# Patient Record
Sex: Male | Born: 1975
Health system: Southern US, Community
[De-identification: ages and names within clinical notes are randomized; demographics above are authoritative.]

## PROBLEM LIST (undated history)

## (undated) DIAGNOSIS — M1990 Unspecified osteoarthritis, unspecified site: Secondary | ICD-10-CM

## (undated) DIAGNOSIS — I10 Essential (primary) hypertension: Secondary | ICD-10-CM

## (undated) HISTORY — PX: HERNIA REPAIR: SHX51

---

## 2019-12-26 ENCOUNTER — Encounter (HOSPITAL_BASED_OUTPATIENT_CLINIC_OR_DEPARTMENT_OTHER): Payer: Self-pay | Admitting: *Deleted

## 2019-12-26 ENCOUNTER — Other Ambulatory Visit: Payer: Self-pay

## 2019-12-26 ENCOUNTER — Inpatient Hospital Stay (HOSPITAL_BASED_OUTPATIENT_CLINIC_OR_DEPARTMENT_OTHER)
Admission: EM | Admit: 2019-12-26 | Discharge: 2019-12-29 | DRG: 287 | Disposition: A | Discharge status: Home / Self Care | Payer: Self-pay | Attending: Student | Admitting: Student

## 2019-12-26 ENCOUNTER — Emergency Department (HOSPITAL_BASED_OUTPATIENT_CLINIC_OR_DEPARTMENT_OTHER): Payer: Self-pay

## 2019-12-26 DIAGNOSIS — D509 Iron deficiency anemia, unspecified: Secondary | ICD-10-CM | POA: Diagnosis present

## 2019-12-26 DIAGNOSIS — Z6834 Body mass index (BMI) 34.0-34.9, adult: Secondary | ICD-10-CM

## 2019-12-26 DIAGNOSIS — E669 Obesity, unspecified: Secondary | ICD-10-CM | POA: Diagnosis present

## 2019-12-26 DIAGNOSIS — I161 Hypertensive emergency: Secondary | ICD-10-CM | POA: Diagnosis present

## 2019-12-26 DIAGNOSIS — R7989 Other specified abnormal findings of blood chemistry: Secondary | ICD-10-CM

## 2019-12-26 DIAGNOSIS — I10 Essential (primary) hypertension: Secondary | ICD-10-CM | POA: Diagnosis present

## 2019-12-26 DIAGNOSIS — G4733 Obstructive sleep apnea (adult) (pediatric): Secondary | ICD-10-CM | POA: Diagnosis present

## 2019-12-26 DIAGNOSIS — Z20822 Contact with and (suspected) exposure to covid-19: Secondary | ICD-10-CM | POA: Diagnosis present

## 2019-12-26 DIAGNOSIS — Z833 Family history of diabetes mellitus: Secondary | ICD-10-CM

## 2019-12-26 DIAGNOSIS — E876 Hypokalemia: Secondary | ICD-10-CM

## 2019-12-26 DIAGNOSIS — F17200 Nicotine dependence, unspecified, uncomplicated: Secondary | ICD-10-CM | POA: Diagnosis present

## 2019-12-26 DIAGNOSIS — Z8249 Family history of ischemic heart disease and other diseases of the circulatory system: Secondary | ICD-10-CM

## 2019-12-26 DIAGNOSIS — K5900 Constipation, unspecified: Secondary | ICD-10-CM | POA: Diagnosis present

## 2019-12-26 DIAGNOSIS — E785 Hyperlipidemia, unspecified: Secondary | ICD-10-CM | POA: Diagnosis present

## 2019-12-26 DIAGNOSIS — F17210 Nicotine dependence, cigarettes, uncomplicated: Secondary | ICD-10-CM | POA: Diagnosis present

## 2019-12-26 DIAGNOSIS — I160 Hypertensive urgency: Secondary | ICD-10-CM

## 2019-12-26 DIAGNOSIS — R079 Chest pain, unspecified: Secondary | ICD-10-CM | POA: Diagnosis present

## 2019-12-26 DIAGNOSIS — T39395A Adverse effect of other nonsteroidal anti-inflammatory drugs [NSAID], initial encounter: Secondary | ICD-10-CM | POA: Diagnosis present

## 2019-12-26 DIAGNOSIS — F1220 Cannabis dependence, uncomplicated: Secondary | ICD-10-CM | POA: Diagnosis present

## 2019-12-26 DIAGNOSIS — N179 Acute kidney failure, unspecified: Secondary | ICD-10-CM | POA: Diagnosis present

## 2019-12-26 DIAGNOSIS — R072 Precordial pain: Principal | ICD-10-CM | POA: Diagnosis present

## 2019-12-26 DIAGNOSIS — I272 Pulmonary hypertension, unspecified: Secondary | ICD-10-CM | POA: Diagnosis present

## 2019-12-26 DIAGNOSIS — R778 Other specified abnormalities of plasma proteins: Secondary | ICD-10-CM

## 2019-12-26 HISTORY — DX: Essential (primary) hypertension: I10

## 2019-12-26 HISTORY — DX: Unspecified osteoarthritis, unspecified site: M19.90

## 2019-12-26 LAB — CBC WITH DIFFERENTIAL/PLATELET
Abs Immature Granulocytes: 0.04 10*3/uL (ref 0.00–0.07)
Basophils Absolute: 0.1 10*3/uL (ref 0.0–0.1)
Basophils Relative: 1 %
Eosinophils Absolute: 0.3 10*3/uL (ref 0.0–0.5)
Eosinophils Relative: 3 %
HCT: 33.8 % — ABNORMAL LOW (ref 39.0–52.0)
Hemoglobin: 10.6 g/dL — ABNORMAL LOW (ref 13.0–17.0)
Immature Granulocytes: 0 %
Lymphocytes Relative: 30 %
Lymphs Abs: 3.1 10*3/uL (ref 0.7–4.0)
MCH: 23.5 pg — ABNORMAL LOW (ref 26.0–34.0)
MCHC: 31.4 g/dL (ref 30.0–36.0)
MCV: 74.9 fL — ABNORMAL LOW (ref 80.0–100.0)
Monocytes Absolute: 0.9 10*3/uL (ref 0.1–1.0)
Monocytes Relative: 8 %
Neutro Abs: 5.9 10*3/uL (ref 1.7–7.7)
Neutrophils Relative %: 58 %
Platelets: 331 10*3/uL (ref 150–400)
RBC: 4.51 MIL/uL (ref 4.22–5.81)
RDW: 18.6 % — ABNORMAL HIGH (ref 11.5–15.5)
WBC: 10.2 10*3/uL (ref 4.0–10.5)
nRBC: 0 % (ref 0.0–0.2)

## 2019-12-26 LAB — BASIC METABOLIC PANEL
Anion gap: 11 (ref 5–15)
BUN: 16 mg/dL (ref 6–20)
CO2: 30 mmol/L (ref 22–32)
Calcium: 8.8 mg/dL — ABNORMAL LOW (ref 8.9–10.3)
Chloride: 96 mmol/L — ABNORMAL LOW (ref 98–111)
Creatinine, Ser: 1.47 mg/dL — ABNORMAL HIGH (ref 0.61–1.24)
GFR calc Af Amer: >60 mL/min (ref >60)
GFR calc non Af Amer: 57 mL/min — ABNORMAL LOW (ref >60)
Glucose, Bld: 125 mg/dL — ABNORMAL HIGH (ref 70–99)
Potassium: 2.4 mmol/L — CL (ref 3.5–5.1)
Sodium: 137 mmol/L (ref 135–145)

## 2019-12-26 LAB — TROPONIN I (HIGH SENSITIVITY): Troponin I (High Sensitivity): 43 ng/L — ABNORMAL HIGH (ref <18)

## 2019-12-26 LAB — MAGNESIUM: Magnesium: 2.1 mg/dL (ref 1.7–2.4)

## 2019-12-26 MED ORDER — METOCLOPRAMIDE HCL 5 MG/ML IJ SOLN
10.0000 mg | Freq: Once | INTRAMUSCULAR | Status: AC
  Administered 2019-12-26: 10 mg via INTRAVENOUS
  Filled 2019-12-26: qty 2

## 2019-12-26 MED ORDER — ASPIRIN 81 MG PO CHEW: 324.0000 mg | CHEWABLE_TABLET | Freq: Once | ORAL | Status: DC

## 2019-12-26 MED ORDER — DIPHENHYDRAMINE HCL 50 MG/ML IJ SOLN
25.0000 mg | Freq: Once | INTRAMUSCULAR | Status: AC
  Administered 2019-12-26: 25 mg via INTRAVENOUS
  Filled 2019-12-26: qty 1

## 2019-12-26 MED ORDER — POTASSIUM CHLORIDE CRYS ER 20 MEQ PO TBCR
40.0000 meq | EXTENDED_RELEASE_TABLET | Freq: Once | ORAL | Status: AC
  Administered 2019-12-27: 40 meq via ORAL
  Filled 2019-12-26: qty 2

## 2019-12-26 MED ORDER — NITROGLYCERIN 0.4 MG SL SUBL
0.4000 mg | SUBLINGUAL_TABLET | SUBLINGUAL | Status: DC | PRN
  Administered 2019-12-26: 0.4 mg via SUBLINGUAL
  Filled 2019-12-26: qty 1

## 2019-12-26 MED ORDER — ASPIRIN 81 MG PO CHEW
324.0000 mg | CHEWABLE_TABLET | Freq: Once | ORAL | Status: AC
  Administered 2019-12-26: 324 mg via ORAL
  Filled 2019-12-26: qty 4

## 2019-12-26 MED ORDER — POTASSIUM CHLORIDE 10 MEQ/100ML IV SOLN
10.0000 meq | INTRAVENOUS | Status: AC
  Administered 2019-12-27 (×3): 10 meq via INTRAVENOUS
  Filled 2019-12-26 (×3): qty 100

## 2019-12-26 MED ORDER — LACTATED RINGERS IV BOLUS
1000.0000 mL | Freq: Once | INTRAVENOUS | Status: AC
  Administered 2019-12-26: 1000 mL via INTRAVENOUS

## 2019-12-26 NOTE — ED Provider Notes (Signed)
[facility] EMERGENCY DEPARTMENT Provider Note   CSN: [id number] Arrival date & time: 12/26/19  1836     History Chief Complaint  Patient presents with  . Hypertension    Roger Wolfe is a 44 y.o. male.  HPI 44 year old male presents with chest pain and headache.  He is vague on the exact symptomatology but states he is been having the chest pain for the past week and a half.  Comes and goes often.  Sometimes all day, sometimes for an hour.  It feels like a heaviness in his chest.  No exertional component.  Sometimes has shortness of breath but is not sure if it really occurs with the chest pain.  He knows he has high blood pressure but has not taken meds since he got out of jail in 2018.  He is also been having headaches since he got out of jail that come and go and are often severe.  Currently has 1 now.  These have been ongoing since 2018.  No vision changes.   Past Medical History:  Diagnosis Date  . Arthritis   . Hypertension     Patient Active Problem List   Diagnosis Date Noted  . Chest pain 12/26/2019    Past Surgical History:  Procedure Laterality Date  . HERNIA REPAIR         No family history on file.  Social History   Tobacco Use  . Smoking status: Current Every Day Smoker  . Smokeless tobacco: Never Used  Substance Use Topics  . Alcohol use: Yes  . Drug use: Yes    Types: Marijuana    Home Medications Prior to Admission medications   Not on File    Allergies    Patient has no known allergies.  Review of Systems   Review of Systems  Constitutional: Negative for fever.  Eyes: Negative for visual disturbance.  Respiratory: Positive for shortness of breath.   Cardiovascular: Positive for chest pain.  Gastrointestinal: Negative for abdominal pain and vomiting.  Neurological: Positive for headaches. Negative for weakness and numbness.  All other systems reviewed and are negative.   Physical Exam Updated Vital Signs BP (!) 179/113    Pulse 70   Temp 98.9 F (37.2 C) (Oral)   Resp 15   Ht 5' (1.524 m)   SpO2 99%   Physical Exam Vitals and nursing note reviewed.  Constitutional:      General: He is not in acute distress.    Appearance: He is well-developed. He is not ill-appearing or diaphoretic.  HENT:     Head: Normocephalic and atraumatic.     Right Ear: External ear normal.     Left Ear: External ear normal.     Nose: Nose normal.  Eyes:     General:        Right eye: No discharge.        Left eye: No discharge.     Extraocular Movements: Extraocular movements intact.     Pupils: Pupils are equal, round, and reactive to light.  Cardiovascular:     Rate and Rhythm: Normal rate and regular rhythm.     Heart sounds: Normal heart sounds.  Pulmonary:     Effort: Pulmonary effort is normal.     Breath sounds: Normal breath sounds.  Chest:     Chest wall: Tenderness present.  Abdominal:     Palpations: Abdomen is soft.     Tenderness: There is no abdominal tenderness.  Musculoskeletal:  Cervical back: Neck supple.  Skin:    General: Skin is warm and dry.  Neurological:     Mental Status: He is alert.     Comments: CN 3-12 grossly intact. 5/5 strength in all 4 extremities. Grossly normal sensation. Normal finger to nose.   Psychiatric:        Mood and Affect: Mood is not anxious.     ED Results / Procedures / Treatments   Labs (all labs ordered are listed, but only abnormal results are displayed) Labs Reviewed  BASIC METABOLIC PANEL - Abnormal; Notable for the following components:      Result Value   Potassium 2.4 (*)    Chloride 96 (*)    Glucose, Bld 125 (*)    Creatinine, Ser 1.47 (*)    Calcium 8.8 (*)    GFR calc non Af Amer 57 (*)    All other components within normal limits  CBC WITH DIFFERENTIAL/PLATELET - Abnormal; Notable for the following components:   Hemoglobin 10.6 (*)    HCT 33.8 (*)    MCV 74.9 (*)    MCH 23.5 (*)    RDW 18.6 (*)    All other components within  normal limits  TROPONIN I (HIGH SENSITIVITY) - Abnormal; Notable for the following components:   Troponin I (High Sensitivity) 43 (*)    All other components within normal limits  SARS CORONAVIRUS 2 (TAT 6-24 HRS)  MAGNESIUM  TROPONIN I (HIGH SENSITIVITY)    EKG EKG Interpretation  Date/Time:  Monday December 26 2019 22:04:19 EST Ventricular Rate:  67 PR Interval:  106 QRS Duration: 99 QT Interval:  432 QTC Calculation: 457 R Axis:   24 Text Interpretation: Sinus rhythm Abnormal R-wave progression, early transition LVH with secondary repolarization abnormality no significant change since earlier in the day Confirmed by Pricilla Loveless [phone]) on 12/26/2019 11:02:13 PM   Radiology DG Chest 2 View  Result Date: 12/26/2019 CLINICAL DATA:  Chest pain EXAM: CHEST - 2 VIEW COMPARISON:  None. FINDINGS: The heart size and mediastinal contours are within normal limits. Both lungs are clear. The visualized skeletal structures are unremarkable. IMPRESSION: No active cardiopulmonary disease. Electronically Signed   By: Jasmine Pang M.D.   On: 12/26/2019 22:34   CT Head Wo Contrast  Result Date: 12/26/2019 CLINICAL DATA:  Headache for 2 weeks EXAM: CT HEAD WITHOUT CONTRAST TECHNIQUE: Contiguous axial images were obtained from the base of the skull through the vertex without intravenous contrast. COMPARISON:  None. FINDINGS: Brain: No acute territorial infarction, hemorrhage or intracranial mass. The ventricles are nonenlarged Vascular: No hyperdense vessel or unexpected calcification. Skull: Normal. Negative for fracture or focal lesion. Sinuses/Orbits: No acute finding. Other: Skin thickening over the left parietal vertex with soft tissue density extending towards the calvarium IMPRESSION: 1. No CT evidence for acute intracranial abnormality. 2. Left parietal vertex scalp thickening with soft tissue density extending to the calvarium, question an area of scarring versus skin lesion. Correlate with  physical examination. Electronically Signed   By: Jasmine Pang M.D.   On: 12/26/2019 22:34    Procedures .Critical Care Performed by: Pricilla Loveless, MD Authorized by: Pricilla Loveless, MD   Critical care provider statement:    Critical care time (minutes):  35   Critical care time was exclusive of:  Separately billable procedures and treating other patients   Critical care was necessary to treat or prevent imminent or life-threatening deterioration of the following conditions:  Cardiac failure   Critical care was  time spent personally by me on the following activities:  Discussions with consultants, evaluation of patient's response to treatment, examination of patient, ordering and performing treatments and interventions, ordering and review of laboratory studies, ordering and review of radiographic studies, pulse oximetry, re-evaluation of patient's condition, obtaining history from patient or surrogate and review of old charts   (including critical care time)  Medications Ordered in ED Medications  nitroGLYCERIN (NITROSTAT) SL tablet 0.4 mg (0.4 mg Sublingual Given 12/26/19 2349)  aspirin chewable tablet 324 mg (324 mg Oral Not Given 12/26/19 2349)  potassium chloride SA (KLOR-CON) CR tablet 40 mEq (has no administration in time range)  potassium chloride 10 mEq in 100 mL IVPB (has no administration in time range)  aspirin chewable tablet 324 mg (324 mg Oral Given 12/26/19 2230)  metoCLOPramide (REGLAN) injection 10 mg (10 mg Intravenous Given 12/26/19 2234)  diphenhydrAMINE (BENADRYL) injection 25 mg (25 mg Intravenous Given 12/26/19 2231)  lactated ringers bolus 1,000 mL (0 mLs Intravenous Stopped 12/26/19 2330)    ED Course  I have reviewed the triage vital signs and the nursing notes.  Pertinent labs & imaging results that were available during my care of the patient were reviewed by me and considered in my medical decision making (see chart for details).    MDM Rules/Calculators/A&P                       Patient is found to have mildly elevated troponin of 43.  This is nonspecific.  He has been having the chest pain on and off for about a week and a half but did recurred today.  Sounds atypical but given his uncontrolled blood pressure and troponin elevation as well as LVH on ECG I think he will need admission for further work-up and care.  Will be given nitroglycerin.  His headache sounds chronic and CT does not find any obvious pathology.  I do not see any obvious skin changes to his scalp.  Neuro exam benign.  Discussed with Dr. Kalman Shan who recommends blood pressure control and echo and trending troponin.  No heparin.  Dr. Marlowe Sax will admit.  He is also found to have significantly low hypokalemia which will be repleted. Final Clinical Impression(s) / ED Diagnoses Final diagnoses:  Hypertensive urgency  Hypokalemia  Elevated troponin    Rx / DC Orders ED Discharge Orders    None       Sherwood Gambler, MD 12/27/19 [phone]

## 2019-12-26 NOTE — ED Triage Notes (Signed)
Chest pain and headache x 2 weeks. Hx HTN. States he has not taken his medication in 2 years since he was incarcerated.

## 2019-12-27 ENCOUNTER — Encounter (HOSPITAL_COMMUNITY): Payer: Self-pay | Admitting: Internal Medicine

## 2019-12-27 ENCOUNTER — Observation Stay (HOSPITAL_BASED_OUTPATIENT_CLINIC_OR_DEPARTMENT_OTHER): Payer: Self-pay

## 2019-12-27 DIAGNOSIS — R0789 Other chest pain: Secondary | ICD-10-CM

## 2019-12-27 DIAGNOSIS — F1220 Cannabis dependence, uncomplicated: Secondary | ICD-10-CM

## 2019-12-27 DIAGNOSIS — E785 Hyperlipidemia, unspecified: Secondary | ICD-10-CM | POA: Diagnosis present

## 2019-12-27 DIAGNOSIS — R079 Chest pain, unspecified: Secondary | ICD-10-CM

## 2019-12-27 DIAGNOSIS — E669 Obesity, unspecified: Secondary | ICD-10-CM | POA: Diagnosis present

## 2019-12-27 DIAGNOSIS — F17200 Nicotine dependence, unspecified, uncomplicated: Secondary | ICD-10-CM | POA: Diagnosis present

## 2019-12-27 DIAGNOSIS — I340 Nonrheumatic mitral (valve) insufficiency: Secondary | ICD-10-CM

## 2019-12-27 DIAGNOSIS — I160 Hypertensive urgency: Secondary | ICD-10-CM | POA: Diagnosis present

## 2019-12-27 LAB — COMPREHENSIVE METABOLIC PANEL
ALT: 20 U/L (ref 0–44)
AST: 27 U/L (ref 15–41)
Albumin: 3.7 g/dL (ref 3.5–5.0)
Alkaline Phosphatase: 66 U/L (ref 38–126)
Anion gap: 11 (ref 5–15)
BUN: 9 mg/dL (ref 6–20)
CO2: 29 mmol/L (ref 22–32)
Calcium: 8.9 mg/dL (ref 8.9–10.3)
Chloride: 101 mmol/L (ref 98–111)
Creatinine, Ser: 1.1 mg/dL (ref 0.61–1.24)
GFR calc Af Amer: >60 mL/min (ref >60)
GFR calc non Af Amer: >60 mL/min (ref >60)
Glucose, Bld: 104 mg/dL — ABNORMAL HIGH (ref 70–99)
Potassium: 3 mmol/L — ABNORMAL LOW (ref 3.5–5.1)
Sodium: 141 mmol/L (ref 135–145)
Total Bilirubin: 0.7 mg/dL (ref 0.3–1.2)
Total Protein: 6.8 g/dL (ref 6.5–8.1)

## 2019-12-27 LAB — ECHOCARDIOGRAM COMPLETE
Height: 60 in
Weight: 2790.4 oz

## 2019-12-27 LAB — SARS CORONAVIRUS 2 (TAT 6-24 HRS): SARS Coronavirus 2: NEGATIVE

## 2019-12-27 LAB — RAPID URINE DRUG SCREEN, HOSP PERFORMED
Amphetamines: NOT DETECTED
Barbiturates: NOT DETECTED
Benzodiazepines: NOT DETECTED
Cocaine: NOT DETECTED
Opiates: NOT DETECTED
Tetrahydrocannabinol: POSITIVE — AB

## 2019-12-27 LAB — LIPID PANEL
Cholesterol: 158 mg/dL (ref 0–200)
HDL: 31 mg/dL — ABNORMAL LOW (ref >40)
LDL Cholesterol: 108 mg/dL — ABNORMAL HIGH (ref 0–99)
Total CHOL/HDL Ratio: 5.1 RATIO
Triglycerides: 97 mg/dL (ref <150)
VLDL: 19 mg/dL (ref 0–40)

## 2019-12-27 LAB — TROPONIN I (HIGH SENSITIVITY): Troponin I (High Sensitivity): 38 ng/L — ABNORMAL HIGH (ref <18)

## 2019-12-27 LAB — HEMOGLOBIN A1C
Hgb A1c MFr Bld: 5.6 % (ref 4.8–5.6)
Mean Plasma Glucose: 114.02 mg/dL

## 2019-12-27 LAB — TSH: TSH: 0.489 u[IU]/mL (ref 0.350–4.500)

## 2019-12-27 LAB — HIV ANTIBODY (ROUTINE TESTING W REFLEX): HIV Screen 4th Generation wRfx: NONREACTIVE

## 2019-12-27 MED ORDER — NICOTINE 21 MG/24HR TD PT24
21.0000 mg | MEDICATED_PATCH | Freq: Every day | TRANSDERMAL | Status: DC
  Administered 2019-12-27 – 2019-12-29 (×3): 21 mg via TRANSDERMAL
  Filled 2019-12-27 (×3): qty 1

## 2019-12-27 MED ORDER — ENSURE ENLIVE PO LIQD
237.0000 mL | Freq: Two times a day (BID) | ORAL | Status: DC
  Administered 2019-12-27 – 2019-12-28 (×3): 237 mL via ORAL

## 2019-12-27 MED ORDER — ONDANSETRON HCL 4 MG/2ML IJ SOLN
4.0000 mg | Freq: Four times a day (QID) | INTRAMUSCULAR | Status: DC | PRN
  Administered 2019-12-27: 4 mg via INTRAVENOUS
  Filled 2019-12-27: qty 2

## 2019-12-27 MED ORDER — AMLODIPINE BESYLATE 10 MG PO TABS
10.0000 mg | ORAL_TABLET | Freq: Every day | ORAL | Status: DC
  Administered 2019-12-28 – 2019-12-29 (×2): 10 mg via ORAL
  Filled 2019-12-27 (×2): qty 1

## 2019-12-27 MED ORDER — HYDRALAZINE HCL 20 MG/ML IJ SOLN: 5.0000 mg | INTRAMUSCULAR | Status: DC | PRN

## 2019-12-27 MED ORDER — POTASSIUM CHLORIDE IN NACL 20-0.9 MEQ/L-% IV SOLN
INTRAVENOUS | Status: DC
  Filled 2019-12-27 (×4): qty 1000

## 2019-12-27 MED ORDER — ASPIRIN EC 81 MG PO TBEC
81.0000 mg | DELAYED_RELEASE_TABLET | Freq: Every day | ORAL | Status: DC
  Administered 2019-12-27 – 2019-12-28 (×2): 81 mg via ORAL
  Filled 2019-12-27: qty 1

## 2019-12-27 MED ORDER — LOSARTAN POTASSIUM 50 MG PO TABS
100.0000 mg | ORAL_TABLET | Freq: Every day | ORAL | Status: DC
  Administered 2019-12-27 – 2019-12-28 (×2): 100 mg via ORAL
  Filled 2019-12-27 (×2): qty 2

## 2019-12-27 MED ORDER — ACETAMINOPHEN 325 MG PO TABS
650.0000 mg | ORAL_TABLET | ORAL | Status: DC | PRN
  Administered 2019-12-27 – 2019-12-29 (×6): 650 mg via ORAL
  Filled 2019-12-27 (×6): qty 2

## 2019-12-27 MED ORDER — ENOXAPARIN SODIUM 40 MG/0.4ML ~~LOC~~ SOLN
40.0000 mg | SUBCUTANEOUS | Status: DC
  Administered 2019-12-27 – 2019-12-28 (×2): 40 mg via SUBCUTANEOUS
  Filled 2019-12-27 (×2): qty 0.4

## 2019-12-27 MED ORDER — ATORVASTATIN CALCIUM 10 MG PO TABS
20.0000 mg | ORAL_TABLET | Freq: Every day | ORAL | Status: DC
  Administered 2019-12-27 – 2019-12-28 (×2): 20 mg via ORAL
  Filled 2019-12-27 (×2): qty 2

## 2019-12-27 MED ORDER — AMLODIPINE BESYLATE 5 MG PO TABS
5.0000 mg | ORAL_TABLET | Freq: Every day | ORAL | Status: DC
  Administered 2019-12-27: 5 mg via ORAL
  Filled 2019-12-27: qty 1

## 2019-12-27 NOTE — Progress Notes (Signed)
Attempted echo. Patient asked that I come back later. Begging for breakfast first. Will get to as schedule permits.

## 2019-12-27 NOTE — H&P (Signed)
History and Physical    Roger Wolfe IBB:[id number] DOB: 02-23-1976 DOA: 12/26/2019  PCP: Patient, No Pcp Per Consultants:  None Patient coming from:  Home - lives with mother, uncle; NOK: Mother, Roger Wolfe, [phone]; girlfriend, Roger Wolfe, [phone]  Chief Complaint: chest pain  HPI: Roger Wolfe is a 44 y.o. male with medical history significant of HTN and arthritis presenting with chest pain.  Chest, head, and abdominal pain.  Symptoms have been "going on for a while, I been fighting it."  Occasional SOB.  Substernal CP and into his back but he has chronic arthritis in his back.  Sporadic CP without obvious exertional component.  Resolves spontaneously.  No current CP but he is having headache.  He has h/o HTN but has not taken this in years.  He had bloody stools and abdominal pain when he was in prison; released in 2018.   ED Course:  Carryover, per Dr. Marlowe Sax:  44 year old male with history of hypertension currently not on any medications presenting with complaints of chest pain x1.5 weeks and headache x2 years. Blood pressure updated. Troponin 43. EKG with LVH and inferolateral T wave inversions. Potassium low at 2.4, unclear etiology. ED provider discussed the case with cardiology who did not recommend starting heparin. Recommended trending troponin and obtaining an echocardiogram in the morning. Cardiology felt that the troponin elevation/ EKG changes are likely due to elevated blood pressure and creatinine 1.5.   Review of Systems: As per HPI; otherwise review of systems reviewed and negative.   Ambulatory Status:  Ambulates without assistance  Past Medical History:  Diagnosis Date  . Arthritis   . Hypertension     Past Surgical History:  Procedure Laterality Date  . HERNIA REPAIR      Social History   Socioeconomic History  . Marital status: Single    Spouse name: Not on file  . Number of children: Not on file  . Years of education: Not on file    . Highest education level: Not on file  Occupational History  . Not on file  Tobacco Use  . Smoking status: Current Every Day Smoker    Packs/day: 2.00    Years: 29.00    Pack years: 58.00  . Smokeless tobacco: Never Used  Substance and Sexual Activity  . Alcohol use: Yes    Comment: occasional beer  . Drug use: Yes    Types: Marijuana    Comment: marijuana every other day  . Sexual activity: Not on file  Other Topics Concern  . Not on file  Social History Narrative  . Not on file   Social Determinants of Health   Financial Resource Strain:   . Difficulty of Paying Living Expenses: Not on file  Food Insecurity:   . Worried About Charity fundraiser in the Last Year: Not on file  . Ran Out of Food in the Last Year: Not on file  Transportation Needs:   . Lack of Transportation (Medical): Not on file  . Lack of Transportation (Non-Medical): Not on file  Physical Activity:   . Days of Exercise per Week: Not on file  . Minutes of Exercise per Session: Not on file  Stress:   . Feeling of Stress : Not on file  Social Connections:   . Frequency of Communication with Friends and Family: Not on file  . Frequency of Social Gatherings with Friends and Family: Not on file  . Attends Religious Services: Not on file  . Active Member of  Clubs or Organizations: Not on file  . Attends Banker Meetings: Not on file  . Marital Status: Not on file  Intimate Partner Violence:   . Fear of Current or Ex-Partner: Not on file  . Emotionally Abused: Not on file  . Physically Abused: Not on file  . Sexually Abused: Not on file    No Known Allergies  Family History  Problem Relation Age of Onset  . CAD Other        cousins  . Diabetes Sister     Prior to Admission medications   Not on File    Physical Exam: Vitals:   12/27/19 0926 12/27/19 1320 12/27/19 1441 12/27/19 1538  BP: (!) 182/117 (!) 173/117 (!) 176/115 (!) 160/90  Pulse:    69  Resp:      Temp:    98.3  F (36.8 C)  TempSrc:    Oral  SpO2: 98%   100%  Weight:      Height:         . General:  Appears calm and comfortable and is NAD . Eyes:   EOMI, normal lids, iris . ENT:  grossly normal hearing, lips & tongue, mmm . Neck:  no LAD, masses or thyromegaly . Cardiovascular:  RRR, no m/r/g. No LE edema.  Marland Kitchen Respiratory:   CTA bilaterally with no wheezes/rales/rhonchi.  Normal respiratory effort. . Abdomen:  soft, NT, ND, NABS . Back:   normal alignment, no CVAT . Skin:  no rash or induration seen on limited exam . Musculoskeletal:  grossly normal tone BUE/BLE, good ROM, no bony abnormality . Psychiatric:  grossly normal mood and affect, speech fluent and appropriate, AOx3 . Neurologic:  CN 2-12 grossly intact, moves all extremities in coordinated fashion    Radiological Exams on Admission: DG Chest 2 View  Result Date: 12/26/2019 CLINICAL DATA:  Chest pain EXAM: CHEST - 2 VIEW COMPARISON:  None. FINDINGS: The heart size and mediastinal contours are within normal limits. Both lungs are clear. The visualized skeletal structures are unremarkable. IMPRESSION: No active cardiopulmonary disease. Electronically Signed   By: Jasmine Pang M.D.   On: 12/26/2019 22:34   CT Head Wo Contrast  Result Date: 12/26/2019 CLINICAL DATA:  Headache for 2 weeks EXAM: CT HEAD WITHOUT CONTRAST TECHNIQUE: Contiguous axial images were obtained from the base of the skull through the vertex without intravenous contrast. COMPARISON:  None. FINDINGS: Brain: No acute territorial infarction, hemorrhage or intracranial mass. The ventricles are nonenlarged Vascular: No hyperdense vessel or unexpected calcification. Skull: Normal. Negative for fracture or focal lesion. Sinuses/Orbits: No acute finding. Other: Skin thickening over the left parietal vertex with soft tissue density extending towards the calvarium IMPRESSION: 1. No CT evidence for acute intracranial abnormality. 2. Left parietal vertex scalp thickening with soft  tissue density extending to the calvarium, question an area of scarring versus skin lesion. Correlate with physical examination. Electronically Signed   By: Jasmine Pang M.D.   On: 12/26/2019 22:34   ECHOCARDIOGRAM COMPLETE  Result Date: 12/27/2019    ECHOCARDIOGRAM REPORT   Patient Name:   JHAYDEN DEMURO Date of Exam: 12/27/2019 Medical Rec #:  [id number]    Height:       60.0 in Accession #:    [id number]   Weight:       174.4 lb Date of Birth:  02-06-1976    BSA:          1.761 m Patient Age:    43 years  BP:           182/117 mmHg Patient Gender: M            HR:           63 bpm. Exam Location:  Inpatient Procedure: 2D Echo, Cardiac Doppler and Color Doppler Indications:    Chest Pain 786.50  History:        Patient has no prior history of Echocardiogram examinations.                 Signs/Symptoms:Chest Pain; Risk Factors:Current Smoker.  Sonographer:    Renella CunasJulia Swaim RDCS Referring Phys: [id number] Deveron Shamoon IMPRESSIONS  1. Left ventricular ejection fraction, by estimation, is 50%. The left ventricle has low normal function. The left ventricle has no regional wall motion abnormalities. The left ventricular internal cavity size was mildly dilated. There is moderate concentric left ventricular hypertrophy. Left ventricular diastolic parameters were surprisingly normal. Only mitral annular diastolic velocities were borderline reduced for age.  2. Right ventricular systolic function is normal. The right ventricular size is normal. There is moderately elevated pulmonary artery systolic pressure.  3. Left atrial size was mildly dilated.  4. The mitral valve is normal in structure. Mild mitral valve regurgitation. No evidence of mitral stenosis.  5. The aortic valve is normal in structure. Aortic valve regurgitation is not visualized. No aortic stenosis is present.  6. The inferior vena cava is normal in size with greater than 50% respiratory variability, suggesting right atrial pressure of 3 mmHg. FINDINGS  Left  Ventricle: Left ventricular ejection fraction, by estimation, is 50%. The left ventricle has low normal function. The left ventricle has no regional wall motion abnormalities. The left ventricular internal cavity size was mildly dilated. There is moderate concentric left ventricular hypertrophy. Left ventricular diastolic parameters were normal. Normal left ventricular filling pressure. Right Ventricle: The right ventricular size is normal. No increase in right ventricular wall thickness. Right ventricular systolic function is normal. There is moderately elevated pulmonary artery systolic pressure. The tricuspid regurgitant velocity is 3.11 m/s, and with an assumed right atrial pressure of 3 mmHg, the estimated right ventricular systolic pressure is 41.7 mmHg. Left Atrium: Left atrial size was mildly dilated. Right Atrium: Right atrial size was normal in size. Pericardium: There is no evidence of pericardial effusion. Mitral Valve: The mitral valve is normal in structure. Normal mobility of the mitral valve leaflets. Mild mitral valve regurgitation. No evidence of mitral valve stenosis. Tricuspid Valve: The tricuspid valve is normal in structure. Tricuspid valve regurgitation is not demonstrated. No evidence of tricuspid stenosis. Aortic Valve: The aortic valve is normal in structure. Aortic valve regurgitation is not visualized. No aortic stenosis is present. Pulmonic Valve: The pulmonic valve was normal in structure. Pulmonic valve regurgitation is not visualized. No evidence of pulmonic stenosis. Aorta: The aortic root is normal in size and structure. Venous: The inferior vena cava is normal in size with greater than 50% respiratory variability, suggesting right atrial pressure of 3 mmHg. IAS/Shunts: No atrial level shunt detected by color flow Doppler.  LEFT VENTRICLE PLAX 2D LVIDd:         5.80 cm      Diastology LVIDs:         4.73 cm      LV e' lateral:   9.57 cm/s LV PW:         1.29 cm      LV E/e' lateral:  7.8 LV IVS:  1.49 cm      LV e' medial:    8.03 cm/s LVOT diam:     1.90 cm      LV E/e' medial:  9.3 LV SV:         54 LV SV Index:   30 LVOT Area:     2.84 cm  LV Volumes (MOD) LV vol d, MOD A2C: 158.0 ml LV vol d, MOD A4C: 178.0 ml LV vol s, MOD A2C: 77.9 ml LV vol s, MOD A4C: 88.6 ml LV SV MOD A2C:     80.1 ml LV SV MOD A4C:     178.0 ml LV SV MOD BP:      84.7 ml RIGHT VENTRICLE RV S prime:     10.30 cm/s TAPSE (M-mode): 2.7 cm LEFT ATRIUM             Index       RIGHT ATRIUM           Index LA diam:        4.00 cm 2.27 cm/m  RA Area:     14.40 cm LA Vol (A2C):   54.6 ml 31.00 ml/m RA Volume:   36.50 ml  20.73 ml/m LA Vol (A4C):   63.0 ml 35.77 ml/m LA Biplane Vol: 59.2 ml 33.62 ml/m  AORTIC VALVE LVOT Vmax:   109.00 cm/s LVOT Vmean:  72.900 cm/s LVOT VTI:    0.189 m  AORTA Ao Root diam: 3.00 cm MITRAL VALVE               TRICUSPID VALVE MV Area (PHT): 5.38 cm    TR Peak grad:   38.7 mmHg MV Decel Time: 141 msec    TR Vmax:        311.00 cm/s MV E velocity: 74.40 cm/s MV A velocity: 34.50 cm/s  SHUNTS MV E/A ratio:  2.16        Systemic VTI:  0.19 m                            Systemic Diam: 1.90 cm Rachelle Hora Croitoru MD Electronically signed by Thurmon Fair MD Signature Date/Time: 12/27/2019/2:54:59 PM    Final     EKG: Independently reviewed.   1854 - NSR with rate 74; LVH with repolarization abnormality 2204 - NSR with rate 67; LVH with repolarization abnormality with NSCSLT  Labs on Admission: I have personally reviewed the available labs and imaging studies at the time of the admission.  Pertinent labs:   K+ 2.4 Glucose 125 BUN 16/Creatinine 1.47/GFR 57 HS troponin 38, 43 WBC 10.2 Hgb 10.6 COVID negative A1c 5.6 HIV negative Lipids: 158/31/108/97 UDS + THC   Assessment/Plan Active Problems:   Chest pain   Chest pain -Patient with unspecified timeframe ("a while") for substernal chest pressure, nonexertional, appears to resolve spontaneously. -1/3 typical symptoms  suggestive of noncardiac chest pain.  -CXR unremarkable.   -Initial cardiac HS troponin mildly elevated with negative delta -EKG not indicative of acute ischemia but with repolarization abnormality   -Will plan to place in observation status on telemetry to rule out ACS by overnight observation.  -Start ASA 81 mg daily -Risk factor stratification with HgbA1c and FLP; will also check TSH and UDS -Cardiology consultation -Echo ordered  Hypertensive urgency -Reports h/o HTN for which he was previously on medication but stopped -We discussed the importance of long-term medication compliance and the potential consequences of long-term uncontrolled HTN -Based  on his poor BP control, will observe patient on progressive care unit -Will add Norvasc -Will add prn hydralazine  Obesity -BMI 34 -Weight loss should be encouraged  HLD -Will start Lipitor based on goal LDL <70  Tobacco dependence -Tobacco Dependence: encourage cessation.   -This was discussed with the patient and should be reviewed on an ongoing basis.   -Patch ordered at patient request.  Marijuana dependence -Cessation encouraged; this should be encouraged on an ongoing basis -UDS ordered     Note: This patient has been tested and is negative for the novel coronavirus COVID-19.    DVT prophylaxis: Lovenox  Code Status:  Full - confirmed with patient Family Communication: None present; I called to discuss the patient with his girlfriend - she can pick him up tomorrow after 3pm Disposition Plan:  He is anticipated to d/c to home without [facility] services once her cardiology issues have been resolved. Consults called: Cardiology  Admission status: It is my clinical opinion that referral for OBSERVATION is reasonable and necessary in this patient based on the above information provided. The aforementioned taken together are felt to place the patient at high risk for further clinical deterioration. However it is anticipated that  the patient may be medically stable for discharge from the hospital within 24 to 48 hours.     Jonah Blue MD [facility]   How to contact the [facility] Attending or Consulting provider 7A - 7P or covering provider during after hours 7P -7A, for this patient?  1. Check the care team in [facility] and look for a) attending/consulting [facility] provider listed and b) the [facility] team listed 2. Log into [email or web address] and use [place]'s universal password to access. If you do not have the password, please contact the hospital operator. 3. Locate the [facility] provider you are looking for under [facility] and page to a number that you can be directly reached. 4. If you still have difficulty reaching the provider, please page the [facility] (Director on Call) for the Hospitalists listed on amion for assistance.   12/27/2019, 4:32 PM

## 2019-12-27 NOTE — Progress Notes (Signed)
  Echocardiogram 2D Echocardiogram has been performed.  Roger Wolfe 12/27/2019, 1:55 PM

## 2019-12-27 NOTE — ED Notes (Signed)
Reviewed trending bp's with Dr Bebe Shaggy; no new orders received; patient denies any pain at this time.

## 2019-12-27 NOTE — Progress Notes (Signed)
Pt BP 182/117. Ophelia Charter, MD aware.

## 2019-12-27 NOTE — ED Notes (Signed)
Microwave dinner prepared and given to patient.

## 2019-12-27 NOTE — Consult Note (Addendum)
Cardiology Consultation:   Patient ID: Logen Heintzelman MRN: [id number]; DOB: Aug 23, 1976  Admit date: 12/26/2019 Date of Consult: 12/27/2019  Primary Care Provider: Patient, No Pcp Per Primary Cardiologist: Jenkins Rouge, MD  Primary Electrophysiologist:  None    Patient Profile:   Colburn Asper is a 44 y.o. male with a hx of HTN, ongoing tobacco use, marijuana use, and arthritis who is being seen today for the evaluation of chest pain and elevated troponin at the request of Dr. Lorin Mercy.  History of Present Illness:   Mr. Lipford does not currently take antihypertensive medications and has no prior cardiac history. He presented to [facility] for evaluation of chest pain and abdominal pain. He is a current every day smoker with marijuana use. UDS pending.   On presentation, he is anemic with Hb 10.6 and hypokalemic with initial K 2.4, now 3.0. He was significantly hypertensive to 182/120. AKI with sCr 1.47, now resolved. Cardiology was asked to consult for chest pain and elevated troponin. He is aware of his HTN diagnosis, but has not taken medication since his release from jail in 2018.   On my interview, he may have taken lopressor in the past for HTN. He denies cocaine use. He reports having nearly daily headaches for greater than 1 year. He works in Research officer, trade union. He reports having chest pain that started when he was walking around about 1 week ago. The chest pain was located in his lower sternum area without radiation. The CP would last for hours and would only be relieved when he slept. CP was almost always associated with a headache. Headache was associated with nausea and occasional vomiting. He denies palpitations, orthopnea, lower extremity swelling, and syncope.  He received 324 mg ASA and nitro x 1 in the ER. He has been chest pain free since arriving at [facility].  He does not have a family history of heart diease and denies history of MI, stroke, and stress test.    Heart Pathway Score:   HEAR Score: 5  Past Medical History:  Diagnosis Date  . Arthritis   . Hypertension     Past Surgical History:  Procedure Laterality Date  . HERNIA REPAIR       Home Medications:  Prior to Admission medications   Medication Sig Start Date End Date Taking? Authorizing Provider  ibuprofen (ADVIL) 200 MG tablet Take 600 mg by mouth every 6 (six) hours as needed for headache.   Yes [provider]    Inpatient Medications: Scheduled Meds: . [START ON 12/28/2019] amLODipine  10 mg Oral Daily  . atorvastatin  20 mg Oral q1800  . enoxaparin (LOVENOX) injection  40 mg Subcutaneous Q24H  . feeding supplement (ENSURE ENLIVE)  237 mL Oral BID BM  . losartan  100 mg Oral Daily   Continuous Infusions: . 0.9 % NaCl with KCl 20 mEq / L 100 mL/hr at 12/27/19 0929   PRN Meds: acetaminophen, hydrALAZINE, ondansetron (ZOFRAN) IV  Allergies:   No Known Allergies  Social History:   Social History   Socioeconomic History  . Marital status: Single    Spouse name: Not on file  . Number of children: Not on file  . Years of education: Not on file  . Highest education level: Not on file  Occupational History  . Not on file  Tobacco Use  . Smoking status: Current Every Day Smoker    Packs/day: 2.00    Years: 29.00    Pack years: 58.00  . Smokeless  tobacco: Never Used  Substance and Sexual Activity  . Alcohol use: Yes    Comment: occasional beer  . Drug use: Yes    Types: Marijuana    Comment: marijuana every other day  . Sexual activity: Not on file  Other Topics Concern  . Not on file  Social History Narrative  . Not on file   Social Determinants of Health   Financial Resource Strain:   . Difficulty of Paying Living Expenses: Not on file  Food Insecurity:   . Worried About Programme researcher, broadcasting/film/video in the Last Year: Not on file  . Ran Out of Food in the Last Year: Not on file  Transportation Needs:   . Lack of Transportation (Medical): Not on file  . Lack of  Transportation (Non-Medical): Not on file  Physical Activity:   . Days of Exercise per Week: Not on file  . Minutes of Exercise per Session: Not on file  Stress:   . Feeling of Stress : Not on file  Social Connections:   . Frequency of Communication with Friends and Family: Not on file  . Frequency of Social Gatherings with Friends and Family: Not on file  . Attends Religious Services: Not on file  . Active Member of Clubs or Organizations: Not on file  . Attends Banker Meetings: Not on file  . Marital Status: Not on file  Intimate Partner Violence:   . Fear of Current or Ex-Partner: Not on file  . Emotionally Abused: Not on file  . Physically Abused: Not on file  . Sexually Abused: Not on file    Family History:    Family History  Problem Relation Age of Onset  . CAD Other        cousins  . Diabetes Sister      ROS:  Please see the history of present illness.   All other ROS reviewed and negative.     Physical Exam/Data:   Vitals:   12/27/19 0400 12/27/19 0410 12/27/19 0522 12/27/19 0926  BP: (!) 157/114 (!) 166/109 (!) 180/109 (!) 182/117  Pulse: 68 65    Resp: (!) 23 16 18    Temp:   98.4 F (36.9 C)   TempSrc:   Oral   SpO2: 100% 100% 100% 98%  Weight:   79.1 kg   Height:   5' (1.524 m)     Intake/Output Summary (Last 24 hours) at 12/27/2019 1234 Last data filed at 12/27/2019 1000 Gross per 24 hour  Intake 574.11 ml  Output 2000 ml  Net -1425.89 ml   Last 3 Weights 12/27/2019  Weight (lbs) 174 lb 6.4 oz  Weight (kg) 79.107 kg     Body mass index is 34.06 kg/m.  General:  Well nourished, well developed, in no acute distress HEENT: normal Lymph: no adenopathy Neck: no JVD Endocrine:  No thryomegaly Vascular: No carotid bruits; FA pulses 2+ bilaterally without bruits  Cardiac:  normal S1, S2; RRR; no murmur  Lungs:  clear to auscultation bilaterally, no wheezing, rhonchi or rales  Abd: soft, nontender, no hepatomegaly  Ext: no  edema Musculoskeletal:  No deformities, BUE and BLE strength normal and equal Skin: warm and dry  Neuro:  CNs 2-12 intact, no focal abnormalities noted Psych:  Normal affect   EKG:  The EKG was personally reviewed and demonstrates:  Sinus rhythm ,HR 78, TWI inferior leads V4/5/6, with LVH, no prior tracings for comparison Telemetry:  Telemetry was personally reviewed and demonstrates:  Sinus  rhythm  Relevant CV Studies:  Echo pending  Laboratory Data:  High Sensitivity Troponin:   Recent Labs  Lab 12/26/19 0033 12/26/19 2213  TROPONINIHS 38* 43*     Chemistry Recent Labs  Lab 12/26/19 2213 12/27/19 0819  NA 137 141  K 2.4* 3.0*  CL 96* 101  CO2 30 29  GLUCOSE 125* 104*  BUN 16 9  CREATININE 1.47* 1.10  CALCIUM 8.8* 8.9  GFRNONAA 57* >60  GFRAA >60 >60  ANIONGAP 11 11    Recent Labs  Lab 12/27/19 0819  PROT 6.8  ALBUMIN 3.7  AST 27  ALT 20  ALKPHOS 66  BILITOT 0.7   Hematology Recent Labs  Lab 12/26/19 2213  WBC 10.2  RBC 4.51  HGB 10.6*  HCT 33.8*  MCV 74.9*  MCH 23.5*  MCHC 31.4  RDW 18.6*  PLT 331   BNPNo results for input(s): BNP, PROBNP in the last 168 hours.  DDimer No results for input(s): DDIMER in the last 168 hours.   Radiology/Studies:  DG Chest 2 View  Result Date: 12/26/2019 CLINICAL DATA:  Chest pain EXAM: CHEST - 2 VIEW COMPARISON:  None. FINDINGS: The heart size and mediastinal contours are within normal limits. Both lungs are clear. The visualized skeletal structures are unremarkable. IMPRESSION: No active cardiopulmonary disease. Electronically Signed   By: Jasmine Pang M.D.   On: 12/26/2019 22:34   CT Head Wo Contrast  Result Date: 12/26/2019 CLINICAL DATA:  Headache for 2 weeks EXAM: CT HEAD WITHOUT CONTRAST TECHNIQUE: Contiguous axial images were obtained from the base of the skull through the vertex without intravenous contrast. COMPARISON:  None. FINDINGS: Brain: No acute territorial infarction, hemorrhage or intracranial  mass. The ventricles are nonenlarged Vascular: No hyperdense vessel or unexpected calcification. Skull: Normal. Negative for fracture or focal lesion. Sinuses/Orbits: No acute finding. Other: Skin thickening over the left parietal vertex with soft tissue density extending towards the calvarium IMPRESSION: 1. No CT evidence for acute intracranial abnormality. 2. Left parietal vertex scalp thickening with soft tissue density extending to the calvarium, question an area of scarring versus skin lesion. Correlate with physical examination. Electronically Signed   By: Jasmine Pang M.D.   On: 12/26/2019 22:34       HEAR Score (for undifferentiated chest pain):  HEAR Score: 5    Assessment and Plan:   1. Chest pain - hs troponin 38 --> 43 - EKG with TWI inferior leads, V4/5/6, with LVH - risk factors for coronary artery disease include smoking, hypertension, and obesity - suspect demand ischemia in the setting of hypertensive emergency - would favor controlling BP and will plan for lexiscan myoview tomorrow   2. Hypertensive emergency with AKI 3. Hypokalemia with unknown etiology - given his hypertension and hypokalemia, suspicion for primary aldosteronism  - will collect renin and aldosterone tomorrow morning (unable to add on to today's collection) - TSH low-normal - patient and partner requested once or twice daily medications on the $4 list - will increase norvasc to 10 mg - will start 100 mg losartan, but may require hydralazine   4. Current every day smoker 5. Daily marijuana use - encouraged cessation of both - he was surprised to learn that smoking can contribute to heart disease      For questions or updates, please contact [facility] Please consult [email or web address] for contact info under     Signed, Marcelino Duster, [place]  12/27/2019 12:34 PM

## 2019-12-27 NOTE — Progress Notes (Signed)
[facility] admissions paged at 0525 with return call from flow manager that Dr. Loney Loh would be up to admit patient.

## 2019-12-27 NOTE — ED Notes (Signed)
[facility] notified ([name]) - patient ready for transport 

## 2019-12-28 ENCOUNTER — Observation Stay (HOSPITAL_COMMUNITY): Payer: Self-pay

## 2019-12-28 DIAGNOSIS — R079 Chest pain, unspecified: Secondary | ICD-10-CM

## 2019-12-28 DIAGNOSIS — G4733 Obstructive sleep apnea (adult) (pediatric): Secondary | ICD-10-CM

## 2019-12-28 DIAGNOSIS — E876 Hypokalemia: Secondary | ICD-10-CM

## 2019-12-28 DIAGNOSIS — D649 Anemia, unspecified: Secondary | ICD-10-CM

## 2019-12-28 LAB — BASIC METABOLIC PANEL
Anion gap: 11 (ref 5–15)
BUN: 10 mg/dL (ref 6–20)
CO2: 26 mmol/L (ref 22–32)
Calcium: 9 mg/dL (ref 8.9–10.3)
Chloride: 102 mmol/L (ref 98–111)
Creatinine, Ser: 1.01 mg/dL (ref 0.61–1.24)
GFR calc Af Amer: >60 mL/min (ref >60)
GFR calc non Af Amer: >60 mL/min (ref >60)
Glucose, Bld: 91 mg/dL (ref 70–99)
Potassium: 3.3 mmol/L — ABNORMAL LOW (ref 3.5–5.1)
Sodium: 139 mmol/L (ref 135–145)

## 2019-12-28 LAB — NM MYOCAR MULTI W/SPECT W/WALL MOTION / EF
Estimated workload: 1 METS
Exercise duration (min): 0 min
Exercise duration (sec): 0 s
MPHR: 176 {beats}/min
Peak HR: 101 {beats}/min
Percent HR: 57 %
Rest HR: 62 {beats}/min

## 2019-12-28 LAB — CBC
HCT: 34.6 % — ABNORMAL LOW (ref 39.0–52.0)
Hemoglobin: 10.8 g/dL — ABNORMAL LOW (ref 13.0–17.0)
MCH: 23 pg — ABNORMAL LOW (ref 26.0–34.0)
MCHC: 31.2 g/dL (ref 30.0–36.0)
MCV: 73.6 fL — ABNORMAL LOW (ref 80.0–100.0)
Platelets: 326 10*3/uL (ref 150–400)
RBC: 4.7 MIL/uL (ref 4.22–5.81)
RDW: 18 % — ABNORMAL HIGH (ref 11.5–15.5)
WBC: 7.2 10*3/uL (ref 4.0–10.5)
nRBC: 0 % (ref 0.0–0.2)

## 2019-12-28 LAB — MAGNESIUM: Magnesium: 1.8 mg/dL (ref 1.7–2.4)

## 2019-12-28 MED ORDER — SODIUM CHLORIDE 0.9% FLUSH
3.0000 mL | Freq: Two times a day (BID) | INTRAVENOUS | Status: DC
  Administered 2019-12-28 – 2019-12-29 (×2): 3 mL via INTRAVENOUS

## 2019-12-28 MED ORDER — REGADENOSON 0.4 MG/5ML IV SOLN
INTRAVENOUS | Status: AC
  Administered 2019-12-28: 0.4 mg via INTRAVENOUS
  Filled 2019-12-28: qty 5

## 2019-12-28 MED ORDER — SENNOSIDES-DOCUSATE SODIUM 8.6-50 MG PO TABS
1.0000 | ORAL_TABLET | Freq: Two times a day (BID) | ORAL | Status: DC | PRN
  Administered 2019-12-28 – 2019-12-29 (×2): 1 via ORAL
  Filled 2019-12-28 (×2): qty 1

## 2019-12-28 MED ORDER — HYDRALAZINE HCL 20 MG/ML IJ SOLN
INTRAMUSCULAR | Status: AC
  Administered 2019-12-28: 5 mg via INTRAVENOUS
  Filled 2019-12-28: qty 1

## 2019-12-28 MED ORDER — REGADENOSON 0.4 MG/5ML IV SOLN
0.4000 mg | Freq: Once | INTRAVENOUS | Status: AC
  Filled 2019-12-28: qty 5

## 2019-12-28 MED ORDER — HYDRALAZINE HCL 50 MG PO TABS
50.0000 mg | ORAL_TABLET | Freq: Two times a day (BID) | ORAL | Status: DC
  Administered 2019-12-28 – 2019-12-29 (×3): 50 mg via ORAL
  Filled 2019-12-28 (×3): qty 1

## 2019-12-28 MED ORDER — POTASSIUM CHLORIDE CRYS ER 20 MEQ PO TBCR
40.0000 meq | EXTENDED_RELEASE_TABLET | ORAL | Status: AC
  Administered 2019-12-28 (×2): 40 meq via ORAL
  Filled 2019-12-28 (×2): qty 2

## 2019-12-28 MED ORDER — TECHNETIUM TC 99M TETROFOSMIN IV KIT
10.2000 | PACK | Freq: Once | INTRAVENOUS | Status: AC | PRN
  Administered 2019-12-28: 10.2 via INTRAVENOUS

## 2019-12-28 MED ORDER — POLYETHYLENE GLYCOL 3350 17 G PO PACK
17.0000 g | PACK | Freq: Two times a day (BID) | ORAL | Status: DC | PRN
  Administered 2019-12-28 – 2019-12-29 (×2): 17 g via ORAL
  Filled 2019-12-28 (×2): qty 1

## 2019-12-28 NOTE — Progress Notes (Signed)
   Roger Wolfe presented for a nuclear stress test today.  No immediate complications.  Stress imaging is pending at this time.  Preliminary EKG findings may be listed in the chart, but the stress test result will not be finalized until perfusion imaging is complete.  One day study, [facility] to read.  Beatriz Stallion, PA-C 12/28/2019, 10:16 AM

## 2019-12-28 NOTE — Progress Notes (Signed)
Subjective:  Denies SSCP, palpitations or Dyspnea Seen in nuclear medicine   Objective:  Vitals:   12/27/19 1441 12/27/19 1538 12/27/19 1954 12/28/19 0445  BP: (!) 176/115 (!) 160/90 (!) 141/98 (!) 167/110  Pulse:  69 73 64  Resp:   20 17  Temp:  98.3 F (36.8 C) 99.3 F (37.4 C) 98.1 F (36.7 C)  TempSrc:  Oral Oral Oral  SpO2:  100% 100% 100%  Weight:    79.5 kg  Height:        Intake/Output from previous day:  Intake/Output Summary (Last 24 hours) at 12/28/2019 0817 Last data filed at 12/28/2019 0536 Gross per 24 hour  Intake 3330.19 ml  Output 2450 ml  Net 880.19 ml    Physical Exam: Affect appropriate Healthy:  appears stated age HEENT: normal Neck supple with no adenopathy JVP normal no bruits no thyromegaly Lungs clear with no wheezing and good diaphragmatic motion Heart:  S1/S2 S4 gallop  no murmur, no rub, gallop or click PMI normal Abdomen: benighn, BS positve, no tenderness, no AAA no bruit.  No HSM or HJR Distal pulses intact with no bruits No edema Neuro non-focal Skin warm and dry No muscular weakness   Lab Results: Basic Metabolic Panel: Recent Labs    12/26/19 2213 12/27/19 0819  NA 137 141  K 2.4* 3.0*  CL 96* 101  CO2 30 29  GLUCOSE 125* 104*  BUN 16 9  CREATININE 1.47* 1.10  CALCIUM 8.8* 8.9  MG 2.1  --    Liver Function Tests: Recent Labs    12/27/19 0819  AST 27  ALT 20  ALKPHOS 66  BILITOT 0.7  PROT 6.8  ALBUMIN 3.7   No results for input(s): LIPASE, AMYLASE in the last 72 hours. CBC: Recent Labs    12/26/19 2213  WBC 10.2  NEUTROABS 5.9  HGB 10.6*  HCT 33.8*  MCV 74.9*  PLT 331   Hemoglobin A1C: Recent Labs    12/27/19 0819  HGBA1C 5.6   Fasting Lipid Panel: Recent Labs    12/27/19 0819  CHOL 158  HDL 31*  LDLCALC 108*  TRIG 97  CHOLHDL 5.1   Thyroid Function Tests: Recent Labs    12/27/19 0819  TSH 0.489    Imaging: DG Chest 2 View  Result Date: 12/26/2019 CLINICAL DATA:  Chest  pain EXAM: CHEST - 2 VIEW COMPARISON:  None. FINDINGS: The heart size and mediastinal contours are within normal limits. Both lungs are clear. The visualized skeletal structures are unremarkable. IMPRESSION: No active cardiopulmonary disease. Electronically Signed   By: Jasmine Pang M.D.   On: 12/26/2019 22:34   CT Head Wo Contrast  Result Date: 12/26/2019 CLINICAL DATA:  Headache for 2 weeks EXAM: CT HEAD WITHOUT CONTRAST TECHNIQUE: Contiguous axial images were obtained from the base of the skull through the vertex without intravenous contrast. COMPARISON:  None. FINDINGS: Brain: No acute territorial infarction, hemorrhage or intracranial mass. The ventricles are nonenlarged Vascular: No hyperdense vessel or unexpected calcification. Skull: Normal. Negative for fracture or focal lesion. Sinuses/Orbits: No acute finding. Other: Skin thickening over the left parietal vertex with soft tissue density extending towards the calvarium IMPRESSION: 1. No CT evidence for acute intracranial abnormality. 2. Left parietal vertex scalp thickening with soft tissue density extending to the calvarium, question an area of scarring versus skin lesion. Correlate with physical examination. Electronically Signed   By: Jasmine Pang M.D.   On: 12/26/2019 22:34   ECHOCARDIOGRAM COMPLETE  Result Date: 12/27/2019    ECHOCARDIOGRAM REPORT   Patient Name:   Roger Wolfe Date of Exam: 12/27/2019 Medical Rec #:  [id number]    Height:       60.0 in Accession #:    [id number]   Weight:       174.4 lb Date of Birth:  1976/04/02    BSA:          1.761 m Patient Age:    44 years     BP:           182/117 mmHg Patient Gender: M            HR:           63 bpm. Exam Location:  Inpatient Procedure: 2D Echo, Cardiac Doppler and Color Doppler Indications:    Chest Pain 786.50  History:        Patient has no prior history of Echocardiogram examinations.                 Signs/Symptoms:Chest Pain; Risk Factors:Current Smoker.  Sonographer:    Renella Cunas RDCS Referring Phys: [id number] JENNIFER YATES IMPRESSIONS  1. Left ventricular ejection fraction, by estimation, is 50%. The left ventricle has low normal function. The left ventricle has no regional wall motion abnormalities. The left ventricular internal cavity size was mildly dilated. There is moderate concentric left ventricular hypertrophy. Left ventricular diastolic parameters were surprisingly normal. Only mitral annular diastolic velocities were borderline reduced for age.  2. Right ventricular systolic function is normal. The right ventricular size is normal. There is moderately elevated pulmonary artery systolic pressure.  3. Left atrial size was mildly dilated.  4. The mitral valve is normal in structure. Mild mitral valve regurgitation. No evidence of mitral stenosis.  5. The aortic valve is normal in structure. Aortic valve regurgitation is not visualized. No aortic stenosis is present.  6. The inferior vena cava is normal in size with greater than 50% respiratory variability, suggesting right atrial pressure of 3 mmHg. FINDINGS  Left Ventricle: Left ventricular ejection fraction, by estimation, is 50%. The left ventricle has low normal function. The left ventricle has no regional wall motion abnormalities. The left ventricular internal cavity size was mildly dilated. There is moderate concentric left ventricular hypertrophy. Left ventricular diastolic parameters were normal. Normal left ventricular filling pressure. Right Ventricle: The right ventricular size is normal. No increase in right ventricular wall thickness. Right ventricular systolic function is normal. There is moderately elevated pulmonary artery systolic pressure. The tricuspid regurgitant velocity is 3.11 m/s, and with an assumed right atrial pressure of 3 mmHg, the estimated right ventricular systolic pressure is 41.7 mmHg. Left Atrium: Left atrial size was mildly dilated. Right Atrium: Right atrial size was normal in size.  Pericardium: There is no evidence of pericardial effusion. Mitral Valve: The mitral valve is normal in structure. Normal mobility of the mitral valve leaflets. Mild mitral valve regurgitation. No evidence of mitral valve stenosis. Tricuspid Valve: The tricuspid valve is normal in structure. Tricuspid valve regurgitation is not demonstrated. No evidence of tricuspid stenosis. Aortic Valve: The aortic valve is normal in structure. Aortic valve regurgitation is not visualized. No aortic stenosis is present. Pulmonic Valve: The pulmonic valve was normal in structure. Pulmonic valve regurgitation is not visualized. No evidence of pulmonic stenosis. Aorta: The aortic root is normal in size and structure. Venous: The inferior vena cava is normal in size with greater than 50% respiratory variability, suggesting right atrial pressure of 3  mmHg. IAS/Shunts: No atrial level shunt detected by color flow Doppler.  LEFT VENTRICLE PLAX 2D LVIDd:         5.80 cm      Diastology LVIDs:         4.73 cm      LV e' lateral:   9.57 cm/s LV PW:         1.29 cm      LV E/e' lateral: 7.8 LV IVS:        1.49 cm      LV e' medial:    8.03 cm/s LVOT diam:     1.90 cm      LV E/e' medial:  9.3 LV SV:         54 LV SV Index:   30 LVOT Area:     2.84 cm  LV Volumes (MOD) LV vol d, MOD A2C: 158.0 ml LV vol d, MOD A4C: 178.0 ml LV vol s, MOD A2C: 77.9 ml LV vol s, MOD A4C: 88.6 ml LV SV MOD A2C:     80.1 ml LV SV MOD A4C:     178.0 ml LV SV MOD BP:      84.7 ml RIGHT VENTRICLE RV S prime:     10.30 cm/s TAPSE (M-mode): 2.7 cm LEFT ATRIUM             Index       RIGHT ATRIUM           Index LA diam:        4.00 cm 2.27 cm/m  RA Area:     14.40 cm LA Vol (A2C):   54.6 ml 31.00 ml/m RA Volume:   36.50 ml  20.73 ml/m LA Vol (A4C):   63.0 ml 35.77 ml/m LA Biplane Vol: 59.2 ml 33.62 ml/m  AORTIC VALVE LVOT Vmax:   109.00 cm/s LVOT Vmean:  72.900 cm/s LVOT VTI:    0.189 m  AORTA Ao Root diam: 3.00 cm MITRAL VALVE               TRICUSPID VALVE MV  Area (PHT): 5.38 cm    TR Peak grad:   38.7 mmHg MV Decel Time: 141 msec    TR Vmax:        311.00 cm/s MV E velocity: 74.40 cm/s MV A velocity: 34.50 cm/s  SHUNTS MV E/A ratio:  2.16        Systemic VTI:  0.19 m                            Systemic Diam: 1.90 cm Mihai Croitoru MD Electronically signed by Sanda Klein MD Signature Date/Time: 12/27/2019/2:54:59 PM    Final     Cardiac Studies:  ECG: SR LVH with strain    Telemetry: NSR 12/28/2019   Echo: EF 50% moderate LVH no valve disease   Medications:   . amLODipine  10 mg Oral Daily  . aspirin EC  81 mg Oral Daily  . atorvastatin  20 mg Oral q1800  . enoxaparin (LOVENOX) injection  40 mg Subcutaneous Q24H  . feeding supplement (ENSURE ENLIVE)  237 mL Oral BID BM  . losartan  100 mg Oral Daily  . nicotine  21 mg Transdermal Daily     . 0.9 % NaCl with KCl 20 mEq / L 100 mL/hr at 12/28/19 0538    Assessment/Plan:   1. HTN:  Long standing poorly Rx  Add hydralazine  50 bid to current meds. He needs primary care f/u ? [facility] suspect he will also need aldactone added in future Renin /Aldo labs pending no renal bruits Discussed effects of nicotine on BP  2. Chest Pain : atypical R/O ECG with LVH strain for myovue today Ok to d/c home from our point of view if myovue non ischemic but he needs primary care f/u for his BP  3. HLD:  On statin   All of his meds will need to be generic and even with this compliance may be and issue   Cardiology will sign off if nuclear study is normal   Charlton Haws 12/28/2019, 8:17 AM

## 2019-12-28 NOTE — Care Management (Signed)
1428 12-28-19 Case Manager received consult for primary care physician. Case Manager spoke with patient and he lives in [place] and only wants clinic in [place]. Case Manager called [facility] [place]- the earliest appointment will be in April; however, patient will need to initiate the registration process. An appointment can then be scheduled after the registration is completed. Patient will have a sliding scale fee for visits once he can be seen. Patient will use the- [facility] once established with the clinic. No further needs from Case Manager at this time. Gala Lewandowsky, RN,BSN Case Manager

## 2019-12-28 NOTE — Progress Notes (Signed)
PROGRESS NOTE  Roger Wolfe URK:[id number] DOB: June 18, 1976   PCP: Patient, No Pcp Per  Patient is from: Home  DOA: 12/26/2019 LOS: 0  Brief Narrative / Interim history: 44 year old male with history of OSA not on CPAP, HTN, tobacco abuse, marijuana use and arthritis presenting with atypical chest pain, headache and abdominal pain.  Patient not on antihypertensive medication.  In ED, elevated BP.  HS trop 38 > 43.  EKG with LVH and TWI in inferolateral leads.  Hgb 10.6 (no prior to compare to). Cr 1.47. K 2.4.  CT head and CXR without acute finding.  Cardiology consulted and patient was admitted for ACS rule out and hypertensive urgency.  Subjective: No major events overnight or this morning.  Complaining of significant mainly over the frontal areas.  Denies vision change or focal neuro symptoms.  Now chest pain-free.  Denies dyspnea, palpitation.  Denies history of exertional chest pain.  Denies family history of CAD.  Has history of sleep apnea.  Has not used CPAP since he was released from prison.  Also reports constipation.  Objective: Vitals:   12/28/19 1001 12/28/19 1003 12/28/19 1103 12/28/19 1253  BP: (!) 168/104 (!) 162/102 (!) 174/119 (!) 142/77  Pulse:      Resp:      Temp:      TempSrc:      SpO2:      Weight:      Height:        Intake/Output Summary (Last 24 hours) at 12/28/2019 1332 Last data filed at 12/28/2019 1254 Gross per 24 hour  Intake 3108.19 ml  Output 2100 ml  Net 1008.19 ml   Filed Weights   12/27/19 0522 12/28/19 0445  Weight: 79.1 kg 79.5 kg    Examination:  GENERAL: No acute distress.  Appears well.  HEENT: MMM.  Vision and hearing grossly intact.  NECK: Supple.  No apparent JVD.  RESP:  No IWOB. Good air movement bilaterally. CVS:  RRR. Heart sounds normal.  ABD/GI/GU: Bowel sounds present. Soft. Non tender.  MSK/EXT:  Moves extremities. No apparent deformity. No edema.  SKIN: no apparent skin lesion or wound NEURO: Awake, alert and  oriented appropriately.  No apparent focal neuro deficit. PSYCH: Calm. Normal affect.  Procedures:  3/10-Myoview pending.  Assessment & Plan: Atypical chest pain: Not acute.  Exertional chest pain.  No family history but significant risk factor including uncontrolled HTN, obesity, untreated OSA, tobacco use, abnormal troponin and EKG. LDL 108.  A1c 5.6.  TSH within normal. -Follow Myoview stress test -Aggressive risk reduction-especially hypertension and OSA -Statin and aspirin  Hypertensive urgency: Blood pressure 174/119.  However, he has not received his medications while n.p.o. for stress test.  -Continue amlodipine, losartan and hydralazine -Appreciate cardiology input -Nightly CPAP.  AKI: Cr 1.47 (admit)>> 1.01.  Resolved.  Hypokalemia -Replenish and recheck  Sleep apnea not on CPAP-was on CPAP when he was in prison. -Nightly CPAP -Needs outpatient sleep study for CPAP.  Tobacco use disorder -Cessation counseling -Nicotine patch  Marijuana use -Consult  Normocytic anemia: Hgb 10.6 (admit)> 10.8.  Noted some bloody streaks on stool due to constipation. -Check anemia panel  Patient without PCP -TOC consulted for PCP                 DVT prophylaxis: Subcu Lovenox Code Status: Full code Family Communication: Updated patient's wife over the phone.  Discharge barrier: Uncontrolled and untreated hypertension in patient with chest pain. Needs close monitoring while adjusting antihypertensive medication.  Myoview results pending. Patient is from: Home Final disposition: Home  Consultants: Cardiology   Microbiology summarized: [id number] negative.  Sch Meds:  Scheduled Meds: . amLODipine  10 mg Oral Daily  . aspirin EC  81 mg Oral Daily  . atorvastatin  20 mg Oral q1800  . enoxaparin (LOVENOX) injection  40 mg Subcutaneous Q24H  . feeding supplement (ENSURE ENLIVE)  237 mL Oral BID BM  . hydrALAZINE  50 mg Oral BID  . losartan  100 mg Oral Daily  .  nicotine  21 mg Transdermal Daily  . potassium chloride  40 mEq Oral Q4H   Continuous Infusions: PRN Meds:.acetaminophen, hydrALAZINE, ondansetron (ZOFRAN) IV, polyethylene glycol, senna-docusate  Antimicrobials: Anti-infectives (From admission, onward)   None       I have personally reviewed the following labs and images: CBC: Recent Labs  Lab 12/26/19 2213 12/28/19 0805  WBC 10.2 7.2  NEUTROABS 5.9  --   HGB 10.6* 10.8*  HCT 33.8* 34.6*  MCV 74.9* 73.6*  PLT 331 326   BMP &GFR Recent Labs  Lab 12/26/19 2213 12/27/19 0819 12/28/19 0805  NA 137 141 139  K 2.4* 3.0* 3.3*  CL 96* 101 102  CO2 30 29 26   GLUCOSE 125* 104* 91  BUN 16 9 10   CREATININE 1.47* 1.10 1.01  CALCIUM 8.8* 8.9 9.0  MG 2.1  --  1.8   Estimated Creatinine Clearance: 81.6 mL/min (by C-G formula based on SCr of 1.01 mg/dL). Liver & Pancreas: Recent Labs  Lab 12/27/19 0819  AST 27  ALT 20  ALKPHOS 66  BILITOT 0.7  PROT 6.8  ALBUMIN 3.7   No results for input(s): LIPASE, AMYLASE in the last 168 hours. No results for input(s): AMMONIA in the last 168 hours. Diabetic: Recent Labs    12/27/19 0819  HGBA1C 5.6   No results for input(s): GLUCAP in the last 168 hours. Cardiac Enzymes: No results for input(s): CKTOTAL, CKMB, CKMBINDEX, TROPONINI in the last 168 hours. No results for input(s): PROBNP in the last 8760 hours. Coagulation Profile: No results for input(s): INR, PROTIME in the last 168 hours. Thyroid Function Tests: Recent Labs    12/27/19 0819  TSH 0.489   Lipid Profile: Recent Labs    12/27/19 0819  CHOL 158  HDL 31*  LDLCALC 108*  TRIG 97  CHOLHDL 5.1   Anemia Panel: No results for input(s): VITAMINB12, FOLATE, FERRITIN, TIBC, IRON, RETICCTPCT in the last 72 hours. Urine analysis: No results found for: COLORURINE, APPEARANCEUR, LABSPEC, PHURINE, GLUCOSEU, HGBUR, BILIRUBINUR, KETONESUR, PROTEINUR, UROBILINOGEN, NITRITE, LEUKOCYTESUR Sepsis Labs: Invalid  input(s): PROCALCITONIN, [place]  Microbiology: Recent Results (from the past 240 hour(s))  SARS CORONAVIRUS 2 (TAT 6-24 HRS) Nasopharyngeal Nasopharyngeal Swab     Status: None   Collection Time: 12/27/19  1:14 AM   Specimen: Nasopharyngeal Swab  Result Value Ref Range Status   SARS Coronavirus 2 NEGATIVE NEGATIVE Final    Comment: (NOTE) SARS-CoV-2 target nucleic acids are NOT DETECTED. The SARS-CoV-2 RNA is generally detectable in upper and lower respiratory specimens during the acute phase of infection. Negative results do not preclude SARS-CoV-2 infection, do not rule out co-infections with other pathogens, and should not be used as the sole basis for treatment or other patient management decisions. Negative results must be combined with clinical observations, patient history, and epidemiological information. The expected result is Negative. Fact Sheet for Patients: SugarRoll.be Fact Sheet for Healthcare Providers: https://www.woods-mathews.com/ This test is not yet approved or cleared by the  [place] [employer] and  has been authorized for detection and/or diagnosis of SARS-CoV-2 by FDA under an TEFL teacher (EUA). This EUA will remain  in effect (meaning this test can be used) for the duration of the COVID-19 declaration under Section 56 4(b)(1) of the Act, 21 U.S.C. section 360bbb-3(b)(1), unless the authorization is terminated or revoked sooner. Performed at [facility], [address], [place], [place] [address]     Radiology Studies: ECHOCARDIOGRAM COMPLETE  Result Date: 12/27/2019    ECHOCARDIOGRAM REPORT   Patient Name:   SHIVANK PINEDO Date of Exam: 12/27/2019 Medical Rec #:  [id number]    Height:       60.0 in Accession #:    [id number]   Weight:       174.4 lb Date of Birth:  Mar 26, 1976    BSA:          1.761 m Patient Age:    43 years     BP:           182/117 mmHg Patient Gender: M            HR:            63 bpm. Exam Location:  Inpatient Procedure: 2D Echo, Cardiac Doppler and Color Doppler Indications:    Chest Pain 786.50  History:        Patient has no prior history of Echocardiogram examinations.                 Signs/Symptoms:Chest Pain; Risk Factors:Current Smoker.  Sonographer:    Renella Cunas RDCS Referring Phys: [id number] JENNIFER YATES IMPRESSIONS  1. Left ventricular ejection fraction, by estimation, is 50%. The left ventricle has low normal function. The left ventricle has no regional wall motion abnormalities. The left ventricular internal cavity size was mildly dilated. There is moderate concentric left ventricular hypertrophy. Left ventricular diastolic parameters were surprisingly normal. Only mitral annular diastolic velocities were borderline reduced for age.  2. Right ventricular systolic function is normal. The right ventricular size is normal. There is moderately elevated pulmonary artery systolic pressure.  3. Left atrial size was mildly dilated.  4. The mitral valve is normal in structure. Mild mitral valve regurgitation. No evidence of mitral stenosis.  5. The aortic valve is normal in structure. Aortic valve regurgitation is not visualized. No aortic stenosis is present.  6. The inferior vena cava is normal in size with greater than 50% respiratory variability, suggesting right atrial pressure of 3 mmHg. FINDINGS  Left Ventricle: Left ventricular ejection fraction, by estimation, is 50%. The left ventricle has low normal function. The left ventricle has no regional wall motion abnormalities. The left ventricular internal cavity size was mildly dilated. There is moderate concentric left ventricular hypertrophy. Left ventricular diastolic parameters were normal. Normal left ventricular filling pressure. Right Ventricle: The right ventricular size is normal. No increase in right ventricular wall thickness. Right ventricular systolic function is normal. There is moderately elevated pulmonary artery  systolic pressure. The tricuspid regurgitant velocity is 3.11 m/s, and with an assumed right atrial pressure of 3 mmHg, the estimated right ventricular systolic pressure is 41.7 mmHg. Left Atrium: Left atrial size was mildly dilated. Right Atrium: Right atrial size was normal in size. Pericardium: There is no evidence of pericardial effusion. Mitral Valve: The mitral valve is normal in structure. Normal mobility of the mitral valve leaflets. Mild mitral valve regurgitation. No evidence of mitral valve stenosis. Tricuspid Valve: The tricuspid valve is normal in  structure. Tricuspid valve regurgitation is not demonstrated. No evidence of tricuspid stenosis. Aortic Valve: The aortic valve is normal in structure. Aortic valve regurgitation is not visualized. No aortic stenosis is present. Pulmonic Valve: The pulmonic valve was normal in structure. Pulmonic valve regurgitation is not visualized. No evidence of pulmonic stenosis. Aorta: The aortic root is normal in size and structure. Venous: The inferior vena cava is normal in size with greater than 50% respiratory variability, suggesting right atrial pressure of 3 mmHg. IAS/Shunts: No atrial level shunt detected by color flow Doppler.  LEFT VENTRICLE PLAX 2D LVIDd:         5.80 cm      Diastology LVIDs:         4.73 cm      LV e' lateral:   9.57 cm/s LV PW:         1.29 cm      LV E/e' lateral: 7.8 LV IVS:        1.49 cm      LV e' medial:    8.03 cm/s LVOT diam:     1.90 cm      LV E/e' medial:  9.3 LV SV:         54 LV SV Index:   30 LVOT Area:     2.84 cm  LV Volumes (MOD) LV vol d, MOD A2C: 158.0 ml LV vol d, MOD A4C: 178.0 ml LV vol s, MOD A2C: 77.9 ml LV vol s, MOD A4C: 88.6 ml LV SV MOD A2C:     80.1 ml LV SV MOD A4C:     178.0 ml LV SV MOD BP:      84.7 ml RIGHT VENTRICLE RV S prime:     10.30 cm/s TAPSE (M-mode): 2.7 cm LEFT ATRIUM             Index       RIGHT ATRIUM           Index LA diam:        4.00 cm 2.27 cm/m  RA Area:     14.40 cm LA Vol (A2C):    54.6 ml 31.00 ml/m RA Volume:   36.50 ml  20.73 ml/m LA Vol (A4C):   63.0 ml 35.77 ml/m LA Biplane Vol: 59.2 ml 33.62 ml/m  AORTIC VALVE LVOT Vmax:   109.00 cm/s LVOT Vmean:  72.900 cm/s LVOT VTI:    0.189 m  AORTA Ao Root diam: 3.00 cm MITRAL VALVE               TRICUSPID VALVE MV Area (PHT): 5.38 cm    TR Peak grad:   38.7 mmHg MV Decel Time: 141 msec    TR Vmax:        311.00 cm/s MV E velocity: 74.40 cm/s MV A velocity: 34.50 cm/s  SHUNTS MV E/A ratio:  2.16        Systemic VTI:  0.19 m                            Systemic Diam: 1.90 cm Rachelle Hora Croitoru MD Electronically signed by Thurmon Fair MD Signature Date/Time: 12/27/2019/2:54:59 PM    Final      Floreine Kingdon T. George Haggart [facility]  If 7PM-7AM, please contact night-coverage [email or web address] Password [email or web address] 12/28/2019, 1:32 PM

## 2019-12-28 NOTE — Progress Notes (Signed)
Patient refused CPAP machine. He says he does not wear one at home and he does not need one here. His SpO2 is 98 on RA. He is in no apparent distress at this time.

## 2019-12-28 NOTE — Progress Notes (Addendum)
Late Entry for 12/27/19 @ 1954: Patient seen and assessed. Patient was in bathroom in room. Patient ambulates back to bed. Physical assessment completed via computerized charting per [facility] policy. Staff nurse makes inquiry related to noncompliance and medication; patient explains that he has not been able to obtain blood pressure medication second to being incarcerated. Per patient high blood pressure was noticed while incarcerated. States that he has a Medical sales representative Adult nurse in [place]). Side rails up x 2. Bed in lowest position and locked. Call light in reach   Late Entry for 03.093.21 @ 2129: Nursing rounds and medication compliance. Nursing rounds completed. Patient talking to male significant other. Discussion held related to importance of compliance with blood pressure medication. Asked is significant other was a member of [employer]-explained that [employer] and [employer] have medications that are $4. Significant other confirmed that she is a member of [employer], and she has had previous conversations with physician and case management related to accessing cost effective medications.

## 2019-12-28 NOTE — Progress Notes (Signed)
Reviewed myovue with patient Suggestive of inferior wall infarct / peri infarct ischemia Defect larger than one would expect for diaphragmatic attenuation And patient is not obese  This with EF 50% on echo  Abnormal ECG Mildly elevated troponin   Favor diagnostic cath in am Risks discussed including stroke, bleeding, contrast reaction MI and  Need for emergency surgery   He is willing to proceed Orders done placed on board BP is improved   Roger Haws MD [facility]

## 2019-12-29 ENCOUNTER — Encounter (HOSPITAL_COMMUNITY)
Admission: EM | Disposition: A | Discharge status: Home / Self Care | Payer: Self-pay | Source: Home / Self Care | Attending: Student

## 2019-12-29 DIAGNOSIS — D500 Iron deficiency anemia secondary to blood loss (chronic): Secondary | ICD-10-CM

## 2019-12-29 DIAGNOSIS — R9439 Abnormal result of other cardiovascular function study: Secondary | ICD-10-CM

## 2019-12-29 DIAGNOSIS — I272 Pulmonary hypertension, unspecified: Secondary | ICD-10-CM

## 2019-12-29 HISTORY — PX: LEFT HEART CATH AND CORONARY ANGIOGRAPHY: CATH118249

## 2019-12-29 LAB — RETICULOCYTES
Immature Retic Fract: 21.6 % — ABNORMAL HIGH (ref 2.3–15.9)
RBC.: 4.73 MIL/uL (ref 4.22–5.81)
Retic Count, Absolute: 65.3 10*3/uL (ref 19.0–186.0)
Retic Ct Pct: 1.4 % (ref 0.4–3.1)

## 2019-12-29 LAB — RENAL FUNCTION PANEL
Albumin: 3.4 g/dL — ABNORMAL LOW (ref 3.5–5.0)
Anion gap: 10 (ref 5–15)
BUN: 9 mg/dL (ref 6–20)
CO2: 24 mmol/L (ref 22–32)
Calcium: 9.1 mg/dL (ref 8.9–10.3)
Chloride: 105 mmol/L (ref 98–111)
Creatinine, Ser: 1.02 mg/dL (ref 0.61–1.24)
GFR calc Af Amer: >60 mL/min (ref >60)
GFR calc non Af Amer: >60 mL/min (ref >60)
Glucose, Bld: 100 mg/dL — ABNORMAL HIGH (ref 70–99)
Phosphorus: 2.7 mg/dL (ref 2.5–4.6)
Potassium: 3 mmol/L — ABNORMAL LOW (ref 3.5–5.1)
Sodium: 139 mmol/L (ref 135–145)

## 2019-12-29 LAB — CBC
HCT: 35.6 % — ABNORMAL LOW (ref 39.0–52.0)
Hemoglobin: 11 g/dL — ABNORMAL LOW (ref 13.0–17.0)
MCH: 23.1 pg — ABNORMAL LOW (ref 26.0–34.0)
MCHC: 30.9 g/dL (ref 30.0–36.0)
MCV: 74.6 fL — ABNORMAL LOW (ref 80.0–100.0)
Platelets: 355 10*3/uL (ref 150–400)
RBC: 4.77 MIL/uL (ref 4.22–5.81)
RDW: 18.4 % — ABNORMAL HIGH (ref 11.5–15.5)
WBC: 9 10*3/uL (ref 4.0–10.5)
nRBC: 0 % (ref 0.0–0.2)

## 2019-12-29 LAB — IRON AND TIBC
Iron: 29 ug/dL — ABNORMAL LOW (ref 45–182)
Saturation Ratios: 6 % — ABNORMAL LOW (ref 17.9–39.5)
TIBC: 497 ug/dL — ABNORMAL HIGH (ref 250–450)
UIBC: 468 ug/dL

## 2019-12-29 LAB — VITAMIN B12: Vitamin B-12: 269 pg/mL (ref 180–914)

## 2019-12-29 LAB — FOLATE: Folate: 13 ng/mL (ref >5.9)

## 2019-12-29 LAB — FERRITIN: Ferritin: 8 ng/mL — ABNORMAL LOW (ref 24–336)

## 2019-12-29 LAB — MAGNESIUM: Magnesium: 2 mg/dL (ref 1.7–2.4)

## 2019-12-29 SURGERY — LEFT HEART CATH AND CORONARY ANGIOGRAPHY
Anesthesia: LOCAL

## 2019-12-29 MED ORDER — ASPIRIN 81 MG PO TBEC: 81.0000 mg | DELAYED_RELEASE_TABLET | Freq: Every day | ORAL | 1 refills | Status: AC

## 2019-12-29 MED ORDER — SENNOSIDES-DOCUSATE SODIUM 8.6-50 MG PO TABS: 1.0000 | ORAL_TABLET | Freq: Two times a day (BID) | ORAL | 1 refills | Status: AC | PRN

## 2019-12-29 MED ORDER — NITROGLYCERIN 1 MG/10 ML FOR IR/CATH LAB
INTRA_ARTERIAL | Status: AC
  Filled 2019-12-29: qty 10

## 2019-12-29 MED ORDER — SODIUM CHLORIDE 0.9 % IV SOLN: 250.0000 mL | INTRAVENOUS | Status: DC | PRN

## 2019-12-29 MED ORDER — MIDAZOLAM HCL 2 MG/2ML IJ SOLN
INTRAMUSCULAR | Status: AC
  Filled 2019-12-29: qty 2

## 2019-12-29 MED ORDER — ATORVASTATIN CALCIUM 20 MG PO TABS: 20.0000 mg | ORAL_TABLET | Freq: Every day | ORAL | 1 refills | Status: DC

## 2019-12-29 MED ORDER — ATORVASTATIN CALCIUM 20 MG PO TABS: 20.0000 mg | ORAL_TABLET | Freq: Every day | ORAL | 1 refills | Status: AC

## 2019-12-29 MED ORDER — SODIUM CHLORIDE 0.9 % WEIGHT BASED INFUSION: 1.0000 mL/kg/h | INTRAVENOUS | Status: DC

## 2019-12-29 MED ORDER — FERROUS SULFATE 325 (65 FE) MG PO TABS: 325.0000 mg | ORAL_TABLET | Freq: Every day | ORAL | 1 refills | Status: AC

## 2019-12-29 MED ORDER — SODIUM CHLORIDE 0.9% FLUSH: 3.0000 mL | INTRAVENOUS | Status: DC | PRN

## 2019-12-29 MED ORDER — HEPARIN (PORCINE) IN NACL 1000-0.9 UT/500ML-% IV SOLN
INTRAVENOUS | Status: AC
  Filled 2019-12-29: qty 500

## 2019-12-29 MED ORDER — HYDRALAZINE HCL 50 MG PO TABS: 50.0000 mg | ORAL_TABLET | Freq: Three times a day (TID) | ORAL | 1 refills | Status: DC

## 2019-12-29 MED ORDER — LABETALOL HCL 5 MG/ML IV SOLN: 10.0000 mg | INTRAVENOUS | Status: AC | PRN

## 2019-12-29 MED ORDER — HEPARIN SODIUM (PORCINE) 1000 UNIT/ML IJ SOLN
INTRAMUSCULAR | Status: AC
  Filled 2019-12-29: qty 1

## 2019-12-29 MED ORDER — POTASSIUM CHLORIDE CRYS ER 20 MEQ PO TBCR
40.0000 meq | EXTENDED_RELEASE_TABLET | ORAL | Status: AC
  Administered 2019-12-29 (×2): 40 meq via ORAL
  Filled 2019-12-29 (×2): qty 2

## 2019-12-29 MED ORDER — HEPARIN SODIUM (PORCINE) 1000 UNIT/ML IJ SOLN
INTRAMUSCULAR | Status: DC | PRN
  Administered 2019-12-29: 4000 [IU] via INTRAVENOUS

## 2019-12-29 MED ORDER — ASPIRIN 81 MG PO TBEC: 81.0000 mg | DELAYED_RELEASE_TABLET | Freq: Every day | ORAL | 1 refills | Status: DC

## 2019-12-29 MED ORDER — LOSARTAN POTASSIUM 100 MG PO TABS: 100.0000 mg | ORAL_TABLET | Freq: Every day | ORAL | 1 refills | Status: DC

## 2019-12-29 MED ORDER — AMLODIPINE BESYLATE 10 MG PO TABS: 10.0000 mg | ORAL_TABLET | Freq: Every day | ORAL | 1 refills | Status: AC

## 2019-12-29 MED ORDER — ACETAMINOPHEN 325 MG PO TABS
650.0000 mg | ORAL_TABLET | ORAL | Status: DC | PRN
  Administered 2019-12-29: 650 mg via ORAL
  Filled 2019-12-29: qty 2

## 2019-12-29 MED ORDER — LIDOCAINE HCL (PF) 1 % IJ SOLN
INTRAMUSCULAR | Status: DC | PRN
  Administered 2019-12-29: 15 mL via INTRADERMAL

## 2019-12-29 MED ORDER — HYDRALAZINE HCL 50 MG PO TABS: 50.0000 mg | ORAL_TABLET | Freq: Three times a day (TID) | ORAL | 1 refills | Status: AC

## 2019-12-29 MED ORDER — AMLODIPINE BESYLATE 10 MG PO TABS: 10.0000 mg | ORAL_TABLET | Freq: Every day | ORAL | 1 refills | Status: DC

## 2019-12-29 MED ORDER — ASPIRIN 81 MG PO CHEW
81.0000 mg | CHEWABLE_TABLET | ORAL | Status: AC
  Administered 2019-12-29: 81 mg via ORAL
  Filled 2019-12-29: qty 1

## 2019-12-29 MED ORDER — SODIUM CHLORIDE 0.9% FLUSH: 3.0000 mL | Freq: Two times a day (BID) | INTRAVENOUS | Status: DC

## 2019-12-29 MED ORDER — SODIUM CHLORIDE 0.9 % WEIGHT BASED INFUSION: 3.0000 mL/kg/h | INTRAVENOUS | Status: DC

## 2019-12-29 MED ORDER — GUAIFENESIN-DM 100-10 MG/5ML PO SYRP
5.0000 mL | ORAL_SOLUTION | ORAL | Status: DC | PRN
  Administered 2019-12-29: 5 mL via ORAL
  Filled 2019-12-29: qty 5

## 2019-12-29 MED ORDER — ENSURE ENLIVE PO LIQD: 237.0000 mL | Freq: Two times a day (BID) | ORAL | Status: DC

## 2019-12-29 MED ORDER — FENTANYL CITRATE (PF) 100 MCG/2ML IJ SOLN
INTRAMUSCULAR | Status: AC
  Filled 2019-12-29: qty 2

## 2019-12-29 MED ORDER — FENTANYL CITRATE (PF) 100 MCG/2ML IJ SOLN
INTRAMUSCULAR | Status: DC | PRN
  Administered 2019-12-29 (×2): 25 ug via INTRAVENOUS

## 2019-12-29 MED ORDER — POTASSIUM CHLORIDE CRYS ER 20 MEQ PO TBCR: 40.0000 meq | EXTENDED_RELEASE_TABLET | ORAL | Status: AC

## 2019-12-29 MED ORDER — ASPIRIN 81 MG PO CHEW: 81.0000 mg | CHEWABLE_TABLET | ORAL | Status: DC

## 2019-12-29 MED ORDER — ONDANSETRON HCL 4 MG/2ML IJ SOLN: 4.0000 mg | Freq: Four times a day (QID) | INTRAMUSCULAR | Status: DC | PRN

## 2019-12-29 MED ORDER — SODIUM CHLORIDE 0.9 % WEIGHT BASED INFUSION
3.0000 mL/kg/h | INTRAVENOUS | Status: DC
  Administered 2019-12-29: 3 mL/kg/h via INTRAVENOUS

## 2019-12-29 MED ORDER — NICOTINE 21 MG/24HR TD PT24: 21.0000 mg | MEDICATED_PATCH | Freq: Every day | TRANSDERMAL | 0 refills | Status: AC

## 2019-12-29 MED ORDER — VERAPAMIL HCL 2.5 MG/ML IV SOLN
INTRA_ARTERIAL | Status: DC | PRN
  Administered 2019-12-29: 09:00:00 5 mL via INTRA_ARTERIAL

## 2019-12-29 MED ORDER — SENNOSIDES-DOCUSATE SODIUM 8.6-50 MG PO TABS: 1.0000 | ORAL_TABLET | Freq: Two times a day (BID) | ORAL | 1 refills | Status: DC | PRN

## 2019-12-29 MED ORDER — ALUM & MAG HYDROXIDE-SIMETH 200-200-20 MG/5ML PO SUSP
30.0000 mL | ORAL | Status: DC | PRN
  Administered 2019-12-29: 30 mL via ORAL
  Filled 2019-12-29: qty 30

## 2019-12-29 MED ORDER — VERAPAMIL HCL 2.5 MG/ML IV SOLN
INTRAVENOUS | Status: AC
  Filled 2019-12-29: qty 2

## 2019-12-29 MED ORDER — NICOTINE 21 MG/24HR TD PT24: 21.0000 mg | MEDICATED_PATCH | Freq: Every day | TRANSDERMAL | 0 refills | Status: DC

## 2019-12-29 MED ORDER — HEPARIN (PORCINE) IN NACL 1000-0.9 UT/500ML-% IV SOLN
INTRAVENOUS | Status: DC | PRN
  Administered 2019-12-29 (×2): 500 mL

## 2019-12-29 MED ORDER — MORPHINE SULFATE (PF) 2 MG/ML IV SOLN: 2.0000 mg | INTRAVENOUS | Status: DC | PRN

## 2019-12-29 MED ORDER — LOSARTAN POTASSIUM 100 MG PO TABS: 100.0000 mg | ORAL_TABLET | Freq: Every day | ORAL | 1 refills | Status: AC

## 2019-12-29 MED ORDER — HYDRALAZINE HCL 20 MG/ML IJ SOLN: 10.0000 mg | INTRAMUSCULAR | Status: AC | PRN

## 2019-12-29 MED ORDER — MIDAZOLAM HCL 2 MG/2ML IJ SOLN
INTRAMUSCULAR | Status: DC | PRN
  Administered 2019-12-29: 1 mg via INTRAVENOUS

## 2019-12-29 MED ORDER — SODIUM CHLORIDE 0.9 % IV SOLN: INTRAVENOUS | Status: AC

## 2019-12-29 MED FILL — AMLODIPINE BESYLATE 10 MG T: 10 | 30 days supply | Qty: 30 | Fill #0

## 2019-12-29 MED FILL — SENEXON-S 8.6-50 MG TABS: 8.6-50 | 30 days supply | Qty: 60 | Fill #0

## 2019-12-29 MED FILL — ATORVASTATIN CALCIUM 20 MG: 20 | 30 days supply | Qty: 30 | Fill #0

## 2019-12-29 MED FILL — hydrALAZINE HCL 50 MG TABS: 50 | 30 days supply | Qty: 90 | Fill #0

## 2019-12-29 MED FILL — LOSARTAN POTASSIUM 100 MG T: 100 | 30 days supply | Qty: 30 | Fill #0

## 2019-12-29 MED FILL — FERROUS SULFATE 325 MG TAB: 325 (65 FE) | 100 days supply | Qty: 100 | Fill #0

## 2019-12-29 MED FILL — ASPIRIN LOW DOSE 81 MG TBEC: 81 | 90 days supply | Qty: 90 | Fill #0

## 2019-12-29 SURGICAL SUPPLY — 8 items
CATH OPTITORQUE TIG 4.0 5F (CATHETERS) ×1 IMPLANT
DEVICE RAD COMP TR BAND LRG (VASCULAR PRODUCTS) ×1 IMPLANT
GLIDESHEATH SLEND A-KIT 6F 22G (SHEATH) ×1 IMPLANT
KIT HEART LEFT (KITS) ×2 IMPLANT
PACK CARDIAC CATHETERIZATION (CUSTOM PROCEDURE TRAY) ×2 IMPLANT
TRANSDUCER W/STOPCOCK (MISCELLANEOUS) ×2 IMPLANT
TUBING CIL FLEX 10 FLL-RA (TUBING) ×2 IMPLANT
WIRE HI TORQ VERSACORE-J 145CM (WIRE) ×1 IMPLANT

## 2019-12-29 NOTE — Progress Notes (Signed)
Initial Nutrition Assessment  DOCUMENTATION CODES:   Obesity unspecified  INTERVENTION:  Continue Ensure Enlive po BID, each supplement provides 350 kcal and 20 grams of protein    NUTRITION DIAGNOSIS:   Unintentional weight loss related to social / environmental circumstances as evidenced by per patient/family report.   GOAL:   Patient will meet greater than or equal to 90% of their needs    MONITOR:   PO intake, Supplement acceptance, Weight trends, I & O's, Labs  REASON FOR ASSESSMENT:   Malnutrition Screening Tool    ASSESSMENT:   Pt with a PMH significant of OSA not on CPAP, HTN, tobacco abuse, marijuana use and arthritis presenting with atypical chest pain, headache and abdominal pain.  RN in room at time of visit. Pt states his appetite is good and has been good PTA. Pt states he eats 2-3x per day, but frequently skips meals due to not wanting to cook. When eating, pt reports always consuming protein.   PO Intake: 75-100% x4 recorded meals. Pt receiving Ensure Enlive BID.   No prior weight history available in the chart. Pt states he does not weigh himself at home, but reports weighing 205 lbs in 2019, unsure of the month. Pt now weighs 175 lbs, which indicates a 14.6% wt loss since 2019, which is not significant for time frame. Pt states this weight loss has been gradual.   Medications reviewed. Labs reviewed: K+ 3.3 (L)   NUTRITION - FOCUSED PHYSICAL EXAM:    Most Recent Value  Orbital Region  No depletion  Upper Arm Region  No depletion  Thoracic and Lumbar Region  No depletion  Buccal Region  No depletion  Temple Region  No depletion  Clavicle Bone Region  No depletion  Clavicle and Acromion Bone Region  No depletion  Scapular Bone Region  No depletion  Dorsal Hand  No depletion  Patellar Region  No depletion  Anterior Thigh Region  No depletion  Posterior Calf Region  No depletion  Edema (RD Assessment)  None  Hair  Reviewed  Eyes  Reviewed   Mouth  Reviewed  Skin  Reviewed  Nails  Reviewed       Diet Order:   Diet Order            Diet regular Room service appropriate? Yes; Fluid consistency: Thin  Diet effective now              EDUCATION NEEDS:   No education needs have been identified at this time  Skin:  Skin Assessment: Reviewed RN Assessment  Last BM:  12/27/19  Height:   Ht Readings from Last 1 Encounters:  12/27/19 5' (1.524 m)    Weight:   Wt Readings from Last 1 Encounters:  12/29/19 79.6 kg    BMI:  Body mass index is 34.26 kg/m.  Estimated Nutritional Needs:   Kcal:  1500-1700  Protein:  80-95 grams  Fluid:  >1.5L/d    Eugene Gavia, MS, RD, LDN RD pager number and weekend/on-call pager number located in Amion.

## 2019-12-29 NOTE — Progress Notes (Signed)
Subjective:  Denies SSCP, palpitations or Dyspnea For cath this am   Objective:  Vitals:   12/28/19 1253 12/28/19 1644 12/28/19 2103 12/29/19 0406  BP: (!) 142/77 (!) 144/97 (!) 180/99 (!) 141/97  Pulse:      Resp:   18 18  Temp: 98.9 F (37.2 C)  98.8 F (37.1 C) 98.6 F (37 C)  TempSrc: Oral  Oral Oral  SpO2:   100% 100%  Weight:    79.6 kg  Height:        Intake/Output from previous day:  Intake/Output Summary (Last 24 hours) at 12/29/2019 0804 Last data filed at 12/28/2019 1514 Gross per 24 hour  Intake 873.97 ml  Output --  Net 873.97 ml    Physical Exam: Affect appropriate Healthy:  appears stated age HEENT: normal Neck supple with no adenopathy JVP normal no bruits no thyromegaly Lungs clear with no wheezing and good diaphragmatic motion Heart:  S1/S2 S4 gallop  no murmur, no rub, gallop or click PMI normal Abdomen: benighn, BS positve, no tenderness, no AAA no bruit.  No HSM or HJR Distal pulses intact with no bruits No edema Neuro non-focal Skin warm and dry No muscular weakness   Lab Results: Basic Metabolic Panel: Recent Labs    12/28/19 0805 12/29/19 0338  NA 139 139  K 3.3* 3.0*  CL 102 105  CO2 26 24  GLUCOSE 91 100*  BUN 10 9  CREATININE 1.01 1.02  CALCIUM 9.0 9.1  MG 1.8 2.0  PHOS  --  2.7   Liver Function Tests: Recent Labs    12/27/19 0819 12/29/19 0338  AST 27  --   ALT 20  --   ALKPHOS 66  --   BILITOT 0.7  --   PROT 6.8  --   ALBUMIN 3.7 3.4*   No results for input(s): LIPASE, AMYLASE in the last 72 hours. CBC: Recent Labs    12/26/19 2213 12/26/19 2213 12/28/19 0805 12/29/19 0338  WBC 10.2   < > 7.2 9.0  NEUTROABS 5.9  --   --   --   HGB 10.6*   < > 10.8* 11.0*  HCT 33.8*   < > 34.6* 35.6*  MCV 74.9*   < > 73.6* 74.6*  PLT 331   < > 326 355   < > = values in this interval not displayed.   Hemoglobin A1C: Recent Labs    12/27/19 0819  HGBA1C 5.6   Fasting Lipid Panel: Recent Labs     12/27/19 0819  CHOL 158  HDL 31*  LDLCALC 108*  TRIG 97  CHOLHDL 5.1   Thyroid Function Tests: Recent Labs    12/27/19 0819  TSH 0.489    Imaging: NM Myocar Multi W/Spect W/Wall Motion / EF  Result Date: 12/28/2019 CLINICAL DATA:  Chest pain. EXAM: MYOCARDIAL IMAGING WITH SPECT (REST AND PHARMACOLOGIC-STRESS) GATED LEFT VENTRICULAR WALL MOTION STUDY LEFT VENTRICULAR EJECTION FRACTION TECHNIQUE: Standard myocardial SPECT imaging was performed after resting intravenous injection of 10 mCi Tc-15m tetrofosmin. Subsequently, intravenous infusion of Lexiscan was performed under the supervision of the Cardiology staff. At peak effect of the drug, 30 mCi Tc-31m tetrofosmin was injected intravenously and standard myocardial SPECT imaging was performed. Quantitative gated imaging was also performed to evaluate left ventricular wall motion, and estimate left ventricular ejection fraction. COMPARISON:  Chest radiograph 12/26/2019 FINDINGS: Perfusion: Matched large defect in the inferior wall potentially partially from diaphragmatic attenuation, but likely representing a large scar in the  RCA distribution. Reverse redistribution along the septum. Equivocal small region of inducible ischemia along the inferior cardiac apex. Wall Motion: Lateral wall hypokinesis Left Ventricular Ejection Fraction: 44 % End diastolic volume 190 ml (moderately elevated) End systolic volume 107 ml (prominently elevated) IMPRESSION: 1. Suspected large inferior wall scar along the RCA distribution, although some of the appearance may be from diaphragmatic attenuation. Equivocal small focus of inducible ischemia along the inferior cardiac apex. 2. Lateral wall hypokinesis. 3. Left ventricular ejection fraction 44% 4. Non invasive risk stratification*: High (due to left ventricular dilatation and suspected large scar) *2012 Appropriate Use Criteria for Coronary Revascularization Focused Update: J Am Coll Cardiol. 2012;59(9):857-881.  http://content.onlinejacc.org/article.aspx?articleid=[email or web address] Electronically Signed   By: Walter  Liebkemann M.D.   On: 12/28/2019 14:27   ECHOCARDIOGRAM COMPLETE  Result Date: 12/27/2019    ECHOCARDIOGRAM REPORT   Patient Name:   Roger Wolfe Date of Exam: 12/27/2019 Medical Rec #:  [id number]    Height:       60.0 in Accession #:    [id number]   Weight:       174.4 lb Date of Birth:  11/28/1975    BSA:          1.761 m Patient Age:    43 years     BP:           182/117 mmHg Patient Gender: M            HR:           63 bpm. Exam Location:  Inpatient Procedure: 2D Echo, Cardiac Doppler and Color Doppler Indications:    Chest Pain 786.50  History:        Patient has no prior history of Echocardiogram examinations.                 Signs/Symptoms:Chest Pain; Risk Factors:Current Smoker.  Sonographer:    Julia Swaim RDCS Referring Phys: [id number] JENNIFER YATES IMPRESSIONS  1. Left ventricular ejection fraction, by estimation, is 50%. The left ventricle has low normal function. The left ventricle has no regional wall motion abnormalities. The left ventricular internal cavity size was mildly dilated. There is moderate concentric left ventricular hypertrophy. Left ventricular diastolic parameters were surprisingly normal. Only mitral annular diastolic velocities were borderline reduced for age.  2. Right ventricular systolic function is normal. The right ventricular size is normal. There is moderately elevated pulmonary artery systolic pressure.  3. Left atrial size was mildly dilated.  4. The mitral valve is normal in structure. Mild mitral valve regurgitation. No evidence of mitral stenosis.  5. The aortic valve is normal in structure. Aortic valve regurgitation is not visualized. No aortic stenosis is present.  6. The inferior vena cava is normal in size with greater than 50% respiratory variability, suggesting right atrial pressure of 3 mmHg. FINDINGS  Left Ventricle: Left ventricular ejection fraction, by estimation, is  50%. The left ventricle has low normal function. The left ventricle has no regional wall motion abnormalities. The left ventricular internal cavity size was mildly dilated. There is moderate concentric left ventricular hypertrophy. Left ventricular diastolic parameters were normal. Normal left ventricular filling pressure. Right Ventricle: The right ventricular size is normal. No increase in right ventricular wall thickness. Right ventricular systolic function is normal. There is moderately elevated pulmonary artery systolic pressure. The tricuspid regurgitant velocity is 3.11 m/s, and with an assumed right atrial pressure of 3 mmHg, the estimated right ventricular systolic pressure is 41.7 mmHg. Left Atrium: Left atrial size was mildly dilated.   Right Atrium: Right atrial size was normal in size. Pericardium: There is no evidence of pericardial effusion. Mitral Valve: The mitral valve is normal in structure. Normal mobility of the mitral valve leaflets. Mild mitral valve regurgitation. No evidence of mitral valve stenosis. Tricuspid Valve: The tricuspid valve is normal in structure. Tricuspid valve regurgitation is not demonstrated. No evidence of tricuspid stenosis. Aortic Valve: The aortic valve is normal in structure. Aortic valve regurgitation is not visualized. No aortic stenosis is present. Pulmonic Valve: The pulmonic valve was normal in structure. Pulmonic valve regurgitation is not visualized. No evidence of pulmonic stenosis. Aorta: The aortic root is normal in size and structure. Venous: The inferior vena cava is normal in size with greater than 50% respiratory variability, suggesting right atrial pressure of 3 mmHg. IAS/Shunts: No atrial level shunt detected by color flow Doppler.  LEFT VENTRICLE PLAX 2D LVIDd:         5.80 cm      Diastology LVIDs:         4.73 cm      LV e' lateral:   9.57 cm/s LV PW:         1.29 cm      LV E/e' lateral: 7.8 LV IVS:        1.49 cm      LV e' medial:    8.03 cm/s LVOT  diam:     1.90 cm      LV E/e' medial:  9.3 LV SV:         54 LV SV Index:   30 LVOT Area:     2.84 cm  LV Volumes (MOD) LV vol d, MOD A2C: 158.0 ml LV vol d, MOD A4C: 178.0 ml LV vol s, MOD A2C: 77.9 ml LV vol s, MOD A4C: 88.6 ml LV SV MOD A2C:     80.1 ml LV SV MOD A4C:     178.0 ml LV SV MOD BP:      84.7 ml RIGHT VENTRICLE RV S prime:     10.30 cm/s TAPSE (M-mode): 2.7 cm LEFT ATRIUM             Index       RIGHT ATRIUM           Index LA diam:        4.00 cm 2.27 cm/m  RA Area:     14.40 cm LA Vol (A2C):   54.6 ml 31.00 ml/m RA Volume:   36.50 ml  20.73 ml/m LA Vol (A4C):   63.0 ml 35.77 ml/m LA Biplane Vol: 59.2 ml 33.62 ml/m  AORTIC VALVE LVOT Vmax:   109.00 cm/s LVOT Vmean:  72.900 cm/s LVOT VTI:    0.189 m  AORTA Ao Root diam: 3.00 cm MITRAL VALVE               TRICUSPID VALVE MV Area (PHT): 5.38 cm    TR Peak grad:   38.7 mmHg MV Decel Time: 141 msec    TR Vmax:        311.00 cm/s MV E velocity: 74.40 cm/s MV A velocity: 34.50 cm/s  SHUNTS MV E/A ratio:  2.16        Systemic VTI:  0.19 m                            Systemic Diam: 1.90 cm Dani Gobble Croitoru MD Electronically signed by Sanda Klein MD Signature Date/Time: 12/27/2019/2:54:59 PM  Final     Cardiac Studies:  ECG: SR LVH with strain    Telemetry: NSR 12/29/2019   Echo: EF 50% moderate LVH no valve disease   Medications:   . amLODipine  10 mg Oral Daily  . aspirin EC  81 mg Oral Daily  . atorvastatin  20 mg Oral q1800  . enoxaparin (LOVENOX) injection  40 mg Subcutaneous Q24H  . feeding supplement (ENSURE ENLIVE)  237 mL Oral BID BM  . hydrALAZINE  50 mg Oral BID  . losartan  100 mg Oral Daily  . nicotine  21 mg Transdermal Daily  . potassium chloride SA  40 mEq Oral Q2H  . sodium chloride flush  3 mL Intravenous Q12H     . sodium chloride    . sodium chloride 1 mL/kg/hr (12/29/19 0515)    Assessment/Plan:   1. HTN:  Long standing poorly Rx  Add hydralazine 50 bid to current meds. He needs primary care f/u ? [facility] suspect he will also need aldactone added in future Renin /Aldo labs pending no renal bruits Discussed effects of nicotine on BP Renin /Aldo pending K low will supplement this am   2. Chest Pain : atypical R/O ECG with LVH strain myovue with inferior wall defect more than expected for diaphragmatic attenuation for diagnostic cath today   3. HLD:  On statin   All of his meds will need to be generic and even with this compliance may be and issue    Charlton Haws 12/29/2019, 8:04 AM

## 2019-12-29 NOTE — H&P (View-Only) (Signed)
Subjective:  Denies SSCP, palpitations or Dyspnea For cath this am   Objective:  Vitals:   12/28/19 1253 12/28/19 1644 12/28/19 2103 12/29/19 0406  BP: (!) 142/77 (!) 144/97 (!) 180/99 (!) 141/97  Pulse:      Resp:   18 18  Temp: 98.9 F (37.2 C)  98.8 F (37.1 C) 98.6 F (37 C)  TempSrc: Oral  Oral Oral  SpO2:   100% 100%  Weight:    79.6 kg  Height:        Intake/Output from previous day:  Intake/Output Summary (Last 24 hours) at 12/29/2019 0804 Last data filed at 12/28/2019 1514 Gross per 24 hour  Intake 873.97 ml  Output --  Net 873.97 ml    Physical Exam: Affect appropriate Healthy:  appears stated age HEENT: normal Neck supple with no adenopathy JVP normal no bruits no thyromegaly Lungs clear with no wheezing and good diaphragmatic motion Heart:  S1/S2 S4 gallop  no murmur, no rub, gallop or click PMI normal Abdomen: benighn, BS positve, no tenderness, no AAA no bruit.  No HSM or HJR Distal pulses intact with no bruits No edema Neuro non-focal Skin warm and dry No muscular weakness   Lab Results: Basic Metabolic Panel: Recent Labs    12/28/19 0805 12/29/19 0338  NA 139 139  K 3.3* 3.0*  CL 102 105  CO2 26 24  GLUCOSE 91 100*  BUN 10 9  CREATININE 1.01 1.02  CALCIUM 9.0 9.1  MG 1.8 2.0  PHOS  --  2.7   Liver Function Tests: Recent Labs    12/27/19 0819 12/29/19 0338  AST 27  --   ALT 20  --   ALKPHOS 66  --   BILITOT 0.7  --   PROT 6.8  --   ALBUMIN 3.7 3.4*   No results for input(s): LIPASE, AMYLASE in the last 72 hours. CBC: Recent Labs    12/26/19 2213 12/26/19 2213 12/28/19 0805 12/29/19 0338  WBC 10.2   < > 7.2 9.0  NEUTROABS 5.9  --   --   --   HGB 10.6*   < > 10.8* 11.0*  HCT 33.8*   < > 34.6* 35.6*  MCV 74.9*   < > 73.6* 74.6*  PLT 331   < > 326 355   < > = values in this interval not displayed.   Hemoglobin A1C: Recent Labs    12/27/19 0819  HGBA1C 5.6   Fasting Lipid Panel: Recent Labs     12/27/19 0819  CHOL 158  HDL 31*  LDLCALC 108*  TRIG 97  CHOLHDL 5.1   Thyroid Function Tests: Recent Labs    12/27/19 0819  TSH 0.489    Imaging: NM Myocar Multi W/Spect W/Wall Motion / EF  Result Date: 12/28/2019 CLINICAL DATA:  Chest pain. EXAM: MYOCARDIAL IMAGING WITH SPECT (REST AND PHARMACOLOGIC-STRESS) GATED LEFT VENTRICULAR WALL MOTION STUDY LEFT VENTRICULAR EJECTION FRACTION TECHNIQUE: Standard myocardial SPECT imaging was performed after resting intravenous injection of 10 mCi Tc-15m tetrofosmin. Subsequently, intravenous infusion of Lexiscan was performed under the supervision of the Cardiology staff. At peak effect of the drug, 30 mCi Tc-31m tetrofosmin was injected intravenously and standard myocardial SPECT imaging was performed. Quantitative gated imaging was also performed to evaluate left ventricular wall motion, and estimate left ventricular ejection fraction. COMPARISON:  Chest radiograph 12/26/2019 FINDINGS: Perfusion: Matched large defect in the inferior wall potentially partially from diaphragmatic attenuation, but likely representing a large scar in the  RCA distribution. Reverse redistribution along the septum. Equivocal small region of inducible ischemia along the inferior cardiac apex. Wall Motion: Lateral wall hypokinesis Left Ventricular Ejection Fraction: 44 % End diastolic volume 190 ml (moderately elevated) End systolic volume 107 ml (prominently elevated) IMPRESSION: 1. Suspected large inferior wall scar along the RCA distribution, although some of the appearance may be from diaphragmatic attenuation. Equivocal small focus of inducible ischemia along the inferior cardiac apex. 2. Lateral wall hypokinesis. 3. Left ventricular ejection fraction 44% 4. Non invasive risk stratification*: High (due to left ventricular dilatation and suspected large scar) *2012 Appropriate Use Criteria for Coronary Revascularization Focused Update: J Am Coll Cardiol. 2012;59(9):857-881.  http://content.[email or web address].aspx?[email or web address] Electronically Signed   By: Gaylyn Rong M.D.   On: 12/28/2019 14:27   ECHOCARDIOGRAM COMPLETE  Result Date: 12/27/2019    ECHOCARDIOGRAM REPORT   Patient Name:   Roger Wolfe Date of Exam: 12/27/2019 Medical Rec #:  [id number]    Height:       60.0 in Accession #:    [id number]   Weight:       174.4 lb Date of Birth:  08-Oct-1976    BSA:          1.761 m Patient Age:    43 years     BP:           182/117 mmHg Patient Gender: M            HR:           63 bpm. Exam Location:  Inpatient Procedure: 2D Echo, Cardiac Doppler and Color Doppler Indications:    Chest Pain 786.50  History:        Patient has no prior history of Echocardiogram examinations.                 Signs/Symptoms:Chest Pain; Risk Factors:Current Smoker.  Sonographer:    Renella Cunas RDCS Referring Phys: [id number] JENNIFER YATES IMPRESSIONS  1. Left ventricular ejection fraction, by estimation, is 50%. The left ventricle has low normal function. The left ventricle has no regional wall motion abnormalities. The left ventricular internal cavity size was mildly dilated. There is moderate concentric left ventricular hypertrophy. Left ventricular diastolic parameters were surprisingly normal. Only mitral annular diastolic velocities were borderline reduced for age.  2. Right ventricular systolic function is normal. The right ventricular size is normal. There is moderately elevated pulmonary artery systolic pressure.  3. Left atrial size was mildly dilated.  4. The mitral valve is normal in structure. Mild mitral valve regurgitation. No evidence of mitral stenosis.  5. The aortic valve is normal in structure. Aortic valve regurgitation is not visualized. No aortic stenosis is present.  6. The inferior vena cava is normal in size with greater than 50% respiratory variability, suggesting right atrial pressure of 3 mmHg. FINDINGS  Left Ventricle: Left ventricular ejection fraction, by estimation, is  50%. The left ventricle has low normal function. The left ventricle has no regional wall motion abnormalities. The left ventricular internal cavity size was mildly dilated. There is moderate concentric left ventricular hypertrophy. Left ventricular diastolic parameters were normal. Normal left ventricular filling pressure. Right Ventricle: The right ventricular size is normal. No increase in right ventricular wall thickness. Right ventricular systolic function is normal. There is moderately elevated pulmonary artery systolic pressure. The tricuspid regurgitant velocity is 3.11 m/s, and with an assumed right atrial pressure of 3 mmHg, the estimated right ventricular systolic pressure is 41.7 mmHg. Left Atrium: Left atrial size was mildly dilated.  Right Atrium: Right atrial size was normal in size. Pericardium: There is no evidence of pericardial effusion. Mitral Valve: The mitral valve is normal in structure. Normal mobility of the mitral valve leaflets. Mild mitral valve regurgitation. No evidence of mitral valve stenosis. Tricuspid Valve: The tricuspid valve is normal in structure. Tricuspid valve regurgitation is not demonstrated. No evidence of tricuspid stenosis. Aortic Valve: The aortic valve is normal in structure. Aortic valve regurgitation is not visualized. No aortic stenosis is present. Pulmonic Valve: The pulmonic valve was normal in structure. Pulmonic valve regurgitation is not visualized. No evidence of pulmonic stenosis. Aorta: The aortic root is normal in size and structure. Venous: The inferior vena cava is normal in size with greater than 50% respiratory variability, suggesting right atrial pressure of 3 mmHg. IAS/Shunts: No atrial level shunt detected by color flow Doppler.  LEFT VENTRICLE PLAX 2D LVIDd:         5.80 cm      Diastology LVIDs:         4.73 cm      LV e' lateral:   9.57 cm/s LV PW:         1.29 cm      LV E/e' lateral: 7.8 LV IVS:        1.49 cm      LV e' medial:    8.03 cm/s LVOT  diam:     1.90 cm      LV E/e' medial:  9.3 LV SV:         54 LV SV Index:   30 LVOT Area:     2.84 cm  LV Volumes (MOD) LV vol d, MOD A2C: 158.0 ml LV vol d, MOD A4C: 178.0 ml LV vol s, MOD A2C: 77.9 ml LV vol s, MOD A4C: 88.6 ml LV SV MOD A2C:     80.1 ml LV SV MOD A4C:     178.0 ml LV SV MOD BP:      84.7 ml RIGHT VENTRICLE RV S prime:     10.30 cm/s TAPSE (M-mode): 2.7 cm LEFT ATRIUM             Index       RIGHT ATRIUM           Index LA diam:        4.00 cm 2.27 cm/m  RA Area:     14.40 cm LA Vol (A2C):   54.6 ml 31.00 ml/m RA Volume:   36.50 ml  20.73 ml/m LA Vol (A4C):   63.0 ml 35.77 ml/m LA Biplane Vol: 59.2 ml 33.62 ml/m  AORTIC VALVE LVOT Vmax:   109.00 cm/s LVOT Vmean:  72.900 cm/s LVOT VTI:    0.189 m  AORTA Ao Root diam: 3.00 cm MITRAL VALVE               TRICUSPID VALVE MV Area (PHT): 5.38 cm    TR Peak grad:   38.7 mmHg MV Decel Time: 141 msec    TR Vmax:        311.00 cm/s MV E velocity: 74.40 cm/s MV A velocity: 34.50 cm/s  SHUNTS MV E/A ratio:  2.16        Systemic VTI:  0.19 m                            Systemic Diam: 1.90 cm Dani Gobble Croitoru MD Electronically signed by Sanda Klein MD Signature Date/Time: 12/27/2019/2:54:59 PM  Final     Cardiac Studies:  ECG: SR LVH with strain    Telemetry: NSR 12/29/2019   Echo: EF 50% moderate LVH no valve disease   Medications:   . amLODipine  10 mg Oral Daily  . aspirin EC  81 mg Oral Daily  . atorvastatin  20 mg Oral q1800  . enoxaparin (LOVENOX) injection  40 mg Subcutaneous Q24H  . feeding supplement (ENSURE ENLIVE)  237 mL Oral BID BM  . hydrALAZINE  50 mg Oral BID  . losartan  100 mg Oral Daily  . nicotine  21 mg Transdermal Daily  . potassium chloride SA  40 mEq Oral Q2H  . sodium chloride flush  3 mL Intravenous Q12H     . sodium chloride    . sodium chloride 1 mL/kg/hr (12/29/19 0515)    Assessment/Plan:   1. HTN:  Long standing poorly Rx  Add hydralazine 50 bid to current meds. He needs primary care f/u ? [facility] suspect he will also need aldactone added in future Renin /Aldo labs pending no renal bruits Discussed effects of nicotine on BP Renin /Aldo pending K low will supplement this am   2. Chest Pain : atypical R/O ECG with LVH strain myovue with inferior wall defect more than expected for diaphragmatic attenuation for diagnostic cath today   3. HLD:  On statin   All of his meds will need to be generic and even with this compliance may be and issue    Roger Wolfe 12/29/2019, 8:04 AM

## 2019-12-29 NOTE — Interval H&P Note (Signed)
Cath Lab Visit (complete for each Cath Lab visit)  Clinical Evaluation Leading to the Procedure:   ACS: No.  Non-ACS:    Anginal Classification: CCS I  Anti-ischemic medical therapy: Minimal Therapy (1 class of medications)  Non-Invasive Test Results: Low-risk stress test findings: cardiac mortality <1%/year  Prior CABG: No previous CABG      History and Physical Interval Note:  12/29/2019 8:48 AM  Roger Wolfe  has presented today for surgery, with the diagnosis of Abnormal stress test.  The various methods of treatment have been discussed with the patient and family. After consideration of risks, benefits and other options for treatment, the patient has consented to  Procedure(s): LEFT HEART CATH AND CORONARY ANGIOGRAPHY (N/A) as a surgical intervention.  The patient's history has been reviewed, patient examined, no change in status, stable for surgery.  I have reviewed the patient's chart and labs.  Questions were answered to the patient's satisfaction.     Nanetta Batty

## 2019-12-29 NOTE — Care Management (Signed)
1625 12-29-19 MATCH completed for this patient. Medications delivered to bedside before transitioned home. Graves-Bigelow, Lamar Laundry, RN, BSN Case Manager

## 2019-12-29 NOTE — Discharge Summary (Signed)
Physician Discharge Summary  Roger Wolfe AYT:[id number] DOB: January 19, 1976 DOA: 12/26/2019  PCP: Patient, No Pcp Per  Admit date: 12/26/2019 Discharge date: 12/29/2019  Admitted From: Home Disposition: Home  Recommendations for Outpatient Follow-up:  1. Follow ups as below. 2. Please obtain CBC/BMP/Mag at follow up 3. Please follow up on the following pending results: Aldosterone/renin level  Home Health: None Equipment/Devices: None  Discharge Condition: Stable CODE STATUS: Full code  Follow-up Information    Family Medicine at [facility]. Go to.   Why: this location to initiate the registration process- an appointment can be scheduled after the registration is completed. Patient will have a sliding scale fee once he can be seen. Pharmacy- [facility] Dept. once established with clinic. Contact information: [address] [place], [address]  Phone: [phone] Mon-Fri: 8:30am - 5:30pm Saturdays (October - March): 9am [facility] Course: 44 year old male with history of OSA not on CPAP, HTN, tobacco abuse, marijuana use and arthritis presenting with atypical chest pain, headache and abdominal pain.  Patient not on antihypertensive medication.  In ED, elevated BP.  HS trop 38 > 43.  EKG with LVH and TWI in inferolateral leads.  Hgb 10.6 (no prior to compare to). Cr 1.47. K 2.4.  CT head and CXR without acute finding.  Cardiology consulted and patient was admitted for ACS rule out and hypertensive urgency.  Patient had a high risk Myoview due to "left ventricular dilation and suspected large inferior wall scar along the RCA distribution and lateral wall hypokinesis".  The decision was made to proceed with left heart catheterization, which was normal.  Myoview result was thought to be false positive related to diaphragmatic attenuation.  2D echo showed EF of 50%, moderate concentric LVH, RVSP of 41.7 mmHg, normal diastolic function and no RWMA.    In  regards to hypertensive urgency, he was a started on amlodipine, losartan and hydralazine with improvement in his blood pressure.  Patient was cleared for discharge by cardiology.  Prescriptions filled at [facility] and medications delivered to bedside prior to discharge.  See individual problem list below for more hospital course.  Discharge Diagnoses:  Atypical chest pain:  No family history but significant risk factor including uncontrolled HTN, obesity, untreated OSA, tobacco use, abnormal troponin and EKG. LDL 108.  A1c 5.6.  TSH within normal.  Left heart catheterization with normal coronaries.  Echocardiogram as above. -Aggressive risk reduction-especially hypertension, OSA and tobacco use -Statin and aspirin  Hypertensive urgency: Improved. -Discharged on amlodipine, losartan and hydralazine -Recommend outpatient sleep study.  Reports history of OSA but on CPAP. -Follow renin and aldosterone level  Moderate pulmonary hypertension: Echo reveals RVSP of 41.7 mmHg.  Risk factor include OSA and tobacco abuse.  -Recommend outpatient sleep study -Encourage tobacco cessation  AKI: Cr 1.47 (admit)>> 1.01.  Likely due to NSAIDs for pain. Resolved. -Recheck BMP at follow-up -Advised to avoid NSAIDs.  Hypokalemia: Replenished prior to discharge. -Recheck BMP  History of OSA not on CPAP-reportedly on CPAP when he was in prison. -Needs-sleep study  Tobacco use disorder -Encouraged to quit smoking.  Provided Crit-Line numbers -Gave Rx for nicotine patch  Marijuana use -Encouraged to avoid  Iron deficiency anemia: Hgb 10.6 (admit)> 10.8>11.  Noted some bloody streaks on stool due to constipation in the past but not anymore.  Iron saturation 6%.  Ferritin 8.  Refused to wait for iron infusion. -Discharged on oral ferrous sulfate 325 mg  twice daily with bowel regimen -Recheck BMP at follow-up -May benefit for colonoscopy outpatient.  Patient without PCP -TOC consulted for  PCP  Discharge Instructions  Discharge Instructions    Call MD for:  difficulty breathing, headache or visual disturbances   Complete by: As directed    Call MD for:  extreme fatigue   Complete by: As directed    Call MD for:  persistant dizziness or light-headedness   Complete by: As directed    Call MD for:  severe uncontrolled pain   Complete by: As directed    Diet - low sodium heart healthy   Complete by: As directed    Discharge instructions   Complete by: As directed    It has been a pleasure taking care of you! You were admitted with chest pain.  After the test is we have done, it is unlikely that your chest pain is related to your heart.  However, your blood pressure is very high which will increase your risk of future heart attack or stroke.  It is very important that you get your blood pressure under good control.  We have started you on medications.  You may also benefit from sleep study for sleep apnea.  This can be arranged by your primary care doctor.  Would recommend not taking over-the-counter pain medication other than plain Tylenol.  We also recommend lifestyle change including diet and exercise which would help with your blood pressure.  We strongly recommend quitting smoking cigarettes as well. Please review your new medication list and the directions before you take your medications. This is very important for your health.  There are many things we can do to help you quit.  You may use nicotine patch to start with.  You can also call 1-800-QUIT-NOW 671-533-8246) for free smoking cessation counseling.    Take care,   Increase activity slowly   Complete by: As directed      Allergies as of 12/29/2019   No Known Allergies     Medication List    STOP taking these medications   ibuprofen 200 MG tablet Commonly known as: ADVIL     TAKE these medications   amLODipine 10 MG tablet Commonly known as: NORVASC Take 1 tablet (10 mg total) by mouth daily. Start  taking on: December 30, 2019   aspirin 81 MG EC tablet Take 1 tablet (81 mg total) by mouth daily. Start taking on: December 30, 2019   atorvastatin 20 MG tablet Commonly known as: LIPITOR Take 1 tablet (20 mg total) by mouth daily at 6 PM.   hydrALAZINE 50 MG tablet Commonly known as: APRESOLINE Take 1 tablet (50 mg total) by mouth 3 (three) times daily.   losartan 100 MG tablet Commonly known as: COZAAR Take 1 tablet (100 mg total) by mouth daily. Start taking on: December 30, 2019   nicotine 21 mg/24hr patch Commonly known as: NICODERM CQ - dosed in mg/24 hours Place 1 patch (21 mg total) onto the skin daily. Start taking on: December 30, 2019   senna-docusate 8.6-50 MG tablet Commonly known as: Senokot-S Take 1 tablet by mouth 2 (two) times daily as needed for moderate constipation.       Consultations:  Cardiology  Procedures/Studies:  2D Echo on 12/27/2019 1. Left ventricular ejection fraction, by estimation, is 50%. The left  ventricle has low normal function. The left ventricle has no regional wall  motion abnormalities. The left ventricular internal cavity size was mildly  dilated. There  is moderate  concentric left ventricular hypertrophy. Left ventricular diastolic  parameters were surprisingly normal. Only mitral annular diastolic  velocities were borderline reduced for age.  2. Right ventricular systolic function is normal. The right ventricular  size is normal. There is moderately elevated pulmonary artery systolic  pressure.  3. Left atrial size was mildly dilated.  4. The mitral valve is normal in structure. Mild mitral valve  regurgitation. No evidence of mitral stenosis.  5. The aortic valve is normal in structure. Aortic valve regurgitation is  not visualized. No aortic stenosis is present.  6. The inferior vena cava is normal in size with greater than 50%  respiratory variability, suggesting right atrial pressure of 3 mmHg.   Myoview on 12/28/2019 1.  Suspected large inferior wall scar along the RCA distribution, although some of the appearance may be from diaphragmatic attenuation. Equivocal small focus of inducible ischemia along the inferior cardiac apex.  2. Lateral wall hypokinesis.  3. Left ventricular ejection fraction 44%  4. Non invasive risk stratification*: High (due to left ventricular dilatation and suspected large scar)  Left heart catheterization on 12/29/2019 IMPRESSION: Roger Wolfe had normal coronary arteries.  I believe his chest pain is noncardiac, potentially related to his hypertension, is Myoview false positive related to diaphragmatic attenuation.  The sheath was removed and a TR band was placed on the right wrist to achieve patent hemostasis.  The patient left lab in stable condition.  He stable for discharge home later today.  DG Chest 2 View  Result Date: 12/26/2019 CLINICAL DATA:  Chest pain EXAM: CHEST - 2 VIEW COMPARISON:  None. FINDINGS: The heart size and mediastinal contours are within normal limits. Both lungs are clear. The visualized skeletal structures are unremarkable. IMPRESSION: No active cardiopulmonary disease. Electronically Signed   By: Jasmine Pang M.D.   On: 12/26/2019 22:34   CT Head Wo Contrast  Result Date: 12/26/2019 CLINICAL DATA:  Headache for 2 weeks EXAM: CT HEAD WITHOUT CONTRAST TECHNIQUE: Contiguous axial images were obtained from the base of the skull through the vertex without intravenous contrast. COMPARISON:  None. FINDINGS: Brain: No acute territorial infarction, hemorrhage or intracranial mass. The ventricles are nonenlarged Vascular: No hyperdense vessel or unexpected calcification. Skull: Normal. Negative for fracture or focal lesion. Sinuses/Orbits: No acute finding. Other: Skin thickening over the left parietal vertex with soft tissue density extending towards the calvarium IMPRESSION: 1. No CT evidence for acute intracranial abnormality. 2. Left parietal vertex scalp  thickening with soft tissue density extending to the calvarium, question an area of scarring versus skin lesion. Correlate with physical examination. Electronically Signed   By: Jasmine Pang M.D.   On: 12/26/2019 22:34   CARDIAC CATHETERIZATION  Result Date: 12/29/2019 Roger Wolfe is a 44 y.o. male  [id number] LOCATION:  FACILITY: [facility] PHYSICIAN: Nanetta Batty, M.D. [birth year] DATE OF PROCEDURE:  12/29/2019 DATE OF DISCHARGE: CARDIAC CATHETERIZATION History obtained from chart review.  Roger Wolfe is a 44 year old African-American male admitted with chest pain.  His enzymes were negative.  His EKG showed LVH with repolarization changes.  He does have a history of hypertension and hyperlipidemia.  A Myoview stress test showed inferior ischemia versus diaphragmatic attenuation.  He was referred for diagnostic coronary angiography to rule out an ischemic etiology.   Roger Wolfe had normal coronary arteries.  I believe his chest pain is noncardiac, potentially related to his hypertension, is Myoview false positive related to diaphragmatic attenuation.  The sheath was removed and a TR band was  placed on the right wrist to achieve patent hemostasis.  The patient left lab in stable condition.  He stable for discharge home later today. Nanetta Batty. MD, [facility] 12/29/2019 9:22 AM   NM Myocar Multi W/Spect Izetta Dakin Motion / EF  Result Date: 12/28/2019 CLINICAL DATA:  Chest pain. EXAM: MYOCARDIAL IMAGING WITH SPECT (REST AND PHARMACOLOGIC-STRESS) GATED LEFT VENTRICULAR WALL MOTION STUDY LEFT VENTRICULAR EJECTION FRACTION TECHNIQUE: Standard myocardial SPECT imaging was performed after resting intravenous injection of 10 mCi Tc-72m tetrofosmin. Subsequently, intravenous infusion of Lexiscan was performed under the supervision of the Cardiology staff. At peak effect of the drug, 30 mCi Tc-20m tetrofosmin was injected intravenously and standard myocardial SPECT imaging was performed. Quantitative gated imaging was also  performed to evaluate left ventricular wall motion, and estimate left ventricular ejection fraction. COMPARISON:  Chest radiograph 12/26/2019 FINDINGS: Perfusion: Matched large defect in the inferior wall potentially partially from diaphragmatic attenuation, but likely representing a large scar in the RCA distribution. Reverse redistribution along the septum. Equivocal small region of inducible ischemia along the inferior cardiac apex. Wall Motion: Lateral wall hypokinesis Left Ventricular Ejection Fraction: 44 % End diastolic volume 190 ml (moderately elevated) End systolic volume 107 ml (prominently elevated) IMPRESSION: 1. Suspected large inferior wall scar along the RCA distribution, although some of the appearance may be from diaphragmatic attenuation. Equivocal small focus of inducible ischemia along the inferior cardiac apex. 2. Lateral wall hypokinesis. 3. Left ventricular ejection fraction 44% 4. Non invasive risk stratification*: High (due to left ventricular dilatation and suspected large scar) *2012 Appropriate Use Criteria for Coronary Revascularization Focused Update: J Am Coll Cardiol. 2012;59(9):857-881. http://content.dementiazones.com.aspx?articleid=1201161 Electronically Signed   By: Gaylyn Rong M.D.   On: 12/28/2019 14:27   ECHOCARDIOGRAM COMPLETE  Result Date: 12/27/2019    ECHOCARDIOGRAM REPORT   Patient Name:   Roger Wolfe Date of Exam: 12/27/2019 Medical Rec #:  [id number]    Height:       60.0 in Accession #:    [id number]   Weight:       174.4 lb Date of Birth:  1976-07-22    BSA:          1.761 m Patient Age:    43 years     BP:           182/117 mmHg Patient Gender: M            HR:           63 bpm. Exam Location:  Inpatient Procedure: 2D Echo, Cardiac Doppler and Color Doppler Indications:    Chest Pain 786.50  History:        Patient has no prior history of Echocardiogram examinations.                 Signs/Symptoms:Chest Pain; Risk Factors:Current Smoker.  Sonographer:     Renella Cunas RDCS Referring Phys: [id number] JENNIFER YATES IMPRESSIONS  1. Left ventricular ejection fraction, by estimation, is 50%. The left ventricle has low normal function. The left ventricle has no regional wall motion abnormalities. The left ventricular internal cavity size was mildly dilated. There is moderate concentric left ventricular hypertrophy. Left ventricular diastolic parameters were surprisingly normal. Only mitral annular diastolic velocities were borderline reduced for age.  2. Right ventricular systolic function is normal. The right ventricular size is normal. There is moderately elevated pulmonary artery systolic pressure.  3. Left atrial size was mildly dilated.  4. The mitral valve is normal in structure. Mild mitral valve regurgitation. No evidence  of mitral stenosis.  5. The aortic valve is normal in structure. Aortic valve regurgitation is not visualized. No aortic stenosis is present.  6. The inferior vena cava is normal in size with greater than 50% respiratory variability, suggesting right atrial pressure of 3 mmHg. FINDINGS  Left Ventricle: Left ventricular ejection fraction, by estimation, is 50%. The left ventricle has low normal function. The left ventricle has no regional wall motion abnormalities. The left ventricular internal cavity size was mildly dilated. There is moderate concentric left ventricular hypertrophy. Left ventricular diastolic parameters were normal. Normal left ventricular filling pressure. Right Ventricle: The right ventricular size is normal. No increase in right ventricular wall thickness. Right ventricular systolic function is normal. There is moderately elevated pulmonary artery systolic pressure. The tricuspid regurgitant velocity is 3.11 m/s, and with an assumed right atrial pressure of 3 mmHg, the estimated right ventricular systolic pressure is 41.7 mmHg. Left Atrium: Left atrial size was mildly dilated. Right Atrium: Right atrial size was normal in size.  Pericardium: There is no evidence of pericardial effusion. Mitral Valve: The mitral valve is normal in structure. Normal mobility of the mitral valve leaflets. Mild mitral valve regurgitation. No evidence of mitral valve stenosis. Tricuspid Valve: The tricuspid valve is normal in structure. Tricuspid valve regurgitation is not demonstrated. No evidence of tricuspid stenosis. Aortic Valve: The aortic valve is normal in structure. Aortic valve regurgitation is not visualized. No aortic stenosis is present. Pulmonic Valve: The pulmonic valve was normal in structure. Pulmonic valve regurgitation is not visualized. No evidence of pulmonic stenosis. Aorta: The aortic root is normal in size and structure. Venous: The inferior vena cava is normal in size with greater than 50% respiratory variability, suggesting right atrial pressure of 3 mmHg. IAS/Shunts: No atrial level shunt detected by color flow Doppler.  LEFT VENTRICLE PLAX 2D LVIDd:         5.80 cm      Diastology LVIDs:         4.73 cm      LV e' lateral:   9.57 cm/s LV PW:         1.29 cm      LV E/e' lateral: 7.8 LV IVS:        1.49 cm      LV e' medial:    8.03 cm/s LVOT diam:     1.90 cm      LV E/e' medial:  9.3 LV SV:         54 LV SV Index:   30 LVOT Area:     2.84 cm  LV Volumes (MOD) LV vol d, MOD A2C: 158.0 ml LV vol d, MOD A4C: 178.0 ml LV vol s, MOD A2C: 77.9 ml LV vol s, MOD A4C: 88.6 ml LV SV MOD A2C:     80.1 ml LV SV MOD A4C:     178.0 ml LV SV MOD BP:      84.7 ml RIGHT VENTRICLE RV S prime:     10.30 cm/s TAPSE (M-mode): 2.7 cm LEFT ATRIUM             Index       RIGHT ATRIUM           Index LA diam:        4.00 cm 2.27 cm/m  RA Area:     14.40 cm LA Vol (A2C):   54.6 ml 31.00 ml/m RA Volume:   36.50 ml  20.73 ml/m LA Vol (A4C):  63.0 ml 35.77 ml/m LA Biplane Vol: 59.2 ml 33.62 ml/m  AORTIC VALVE LVOT Vmax:   109.00 cm/s LVOT Vmean:  72.900 cm/s LVOT VTI:    0.189 m  AORTA Ao Root diam: 3.00 cm MITRAL VALVE               TRICUSPID VALVE MV  Area (PHT): 5.38 cm    TR Peak grad:   38.7 mmHg MV Decel Time: 141 msec    TR Vmax:        311.00 cm/s MV E velocity: 74.40 cm/s MV A velocity: 34.50 cm/s  SHUNTS MV E/A ratio:  2.16        Systemic VTI:  0.19 m                            Systemic Diam: 1.90 cm Dani Gobble Croitoru MD Electronically signed by Sanda Klein MD Signature Date/Time: 12/27/2019/2:54:59 PM    Final        Discharge Exam: Vitals:   12/29/19 0937 12/29/19 1423  BP: 139/88 (!) 148/92  Pulse:  87  Resp:    Temp:  98.5 F (36.9 C)  SpO2: 99% 100%    GENERAL: No acute distress.  Appears well.  HEENT: MMM.  Vision and hearing grossly intact.  NECK: Supple.  No apparent JVD.  RESP:  No IWOB. Good air movement bilaterally. CVS:  RRR. Heart sounds normal.  ABD/GI/GU: Bowel sounds present. Soft. Non tender.  MSK/EXT:  Moves extremities. No apparent deformity or edema.  SKIN: no apparent skin lesion or wound NEURO: Awake, alert and oriented appropriately.  No apparent focal neuro deficit. PSYCH: Calm. Normal affect.    The results of significant diagnostics from this hospitalization (including imaging, microbiology, ancillary and laboratory) are listed below for reference.     Microbiology: Recent Results (from the past 240 hour(s))  SARS CORONAVIRUS 2 (TAT 6-24 HRS) Nasopharyngeal Nasopharyngeal Swab     Status: None   Collection Time: 12/27/19  1:14 AM   Specimen: Nasopharyngeal Swab  Result Value Ref Range Status   SARS Coronavirus 2 NEGATIVE NEGATIVE Final    Comment: (NOTE) SARS-CoV-2 target nucleic acids are NOT DETECTED. The SARS-CoV-2 RNA is generally detectable in upper and lower respiratory specimens during the acute phase of infection. Negative results do not preclude SARS-CoV-2 infection, do not rule out co-infections with other pathogens, and should not be used as the sole basis for treatment or other patient management decisions. Negative results must be combined with clinical  observations, patient history, and epidemiological information. The expected result is Negative. Fact Sheet for Patients: SugarRoll.be Fact Sheet for Healthcare Providers: https://www.woods-mathews.com/ This test is not yet approved or cleared by the [place] FDA and  has been authorized for detection and/or diagnosis of SARS-CoV-2 by FDA under an Emergency Use Authorization (EUA). This EUA will remain  in effect (meaning this test can be used) for the duration of the COVID-19 declaration under Section 56 4(b)(1) of the Act, 21 U.S.C. section 360bbb-3(b)(1), unless the authorization is terminated or revoked sooner. Performed at [facility], [place] [address] [place], [place] [address]      Labs: BNP (last 3 results) No results for input(s): BNP in the last 8760 hours. Basic Metabolic Panel: Recent Labs  Lab 12/26/19 2213 12/27/19 0819 12/28/19 0805 12/29/19 0338  NA 137 141 139 139  K 2.4* 3.0* 3.3* 3.0*  CL 96* 101 102 105  CO2 30  GLUCOSE 125* 104* 91 100*  BUN CREATININE 1.47* 1.10 1.01 1.02  CALCIUM 8.8* 8.9 9.0 9.1  MG 2.1  --  1.8 2.0  PHOS  --   --   --  2.7   Liver Function Tests: Recent Labs  Lab 12/27/19 0819 12/29/19 0338  AST 27  --   ALT 20  --   ALKPHOS 66  --   BILITOT 0.7  --   PROT 6.8  --   ALBUMIN 3.7 3.4*   No results for input(s): LIPASE, AMYLASE in the last 168 hours. No results for input(s): AMMONIA in the last 168 hours. CBC: Recent Labs  Lab 12/26/19 2213 12/28/19 0805 12/29/19 0338  WBC 10.2 7.2 9.0  NEUTROABS 5.9  --   --   HGB 10.6* 10.8* 11.0*  HCT 33.8* 34.6* 35.6*  MCV 74.9* 73.6* 74.6*  PLT 331 326 355   Cardiac Enzymes: No results for input(s): CKTOTAL, CKMB, CKMBINDEX, TROPONINI in the last 168 hours. BNP: Invalid input(s): POCBNP CBG: No results for input(s): GLUCAP in the last 168 hours. D-Dimer No results for input(s): DDIMER in the last  72 hours. Hgb A1c Recent Labs    12/27/19 0819  HGBA1C 5.6   Lipid Profile Recent Labs    12/27/19 0819  CHOL 158  HDL 31*  LDLCALC 108*  TRIG 97  CHOLHDL 5.1   Thyroid function studies Recent Labs    12/27/19 0819  TSH 0.489   Anemia work up Recent Labs    12/29/19 0813  VITAMINB12 269  FOLATE 13.0  FERRITIN 8*  TIBC 497*  IRON 29*  RETICCTPCT 1.4   Urinalysis No results found for: COLORURINE, APPEARANCEUR, LABSPEC, PHURINE, GLUCOSEU, HGBUR, BILIRUBINUR, KETONESUR, PROTEINUR, UROBILINOGEN, NITRITE, LEUKOCYTESUR Sepsis Labs Invalid input(s): PROCALCITONIN,  WBC,  LACTICIDVEN   Time coordinating discharge: 35 minutes  SIGNED:  Almon Hercules, MD  [facility] 12/29/2019, 3:08 PM  If 7PM-7AM, please contact night-coverage [email or web address] Password [email or web address]

## 2020-01-07 LAB — ALDOSTERONE + RENIN ACTIVITY W/ RATIO
ALDO / PRA Ratio: 0.6 (ref 0.0–30.0)
Aldosterone: 10.7 ng/dL (ref 0.0–30.0)
PRA LC/MS/MS: 18.475 ng/mL/hr — ABNORMAL HIGH (ref 0.167–5.380)

## 2020-01-09 ENCOUNTER — Telehealth: Payer: Self-pay

## 2020-01-09 NOTE — Telephone Encounter (Signed)
LMTCB

## 2020-01-09 NOTE — Telephone Encounter (Addendum)
-----   Message from Marcelino Duster, [place] sent at 01/08/2020 10:32 PM EDT ----- This patient was seen by our service in consult during a recent hospitalization and was hypertensive with hypokalemia. LHC unremarkable. Does not appear to have a follow up appt with [place]. Aldosterone/renin ratio was elevated. He was started on anti-hypertensive medications at discharge, but not spironolactone.   Triage - He does not have a PCP to follow this lab. Can you please make him an appt with Dr. Eden Emms or an APP in the next 2 weeks?    Hayley - can you please let the patient know he needs to see nephrology and place an ambulatory referral? His lab work from his recent hospitalization indicates an abnormal hormone level that may be contributing to his hypertension. In consultation with Dr. Eden Emms, he would best be served by seeing nephrology.

## 2020-01-18 NOTE — Telephone Encounter (Signed)
Patient has appointment with Reggy Eye NP on 01/31/20

## 2020-01-25 ENCOUNTER — Telehealth: Payer: Self-pay | Admitting: Cardiovascular Disease

## 2020-01-25 NOTE — Telephone Encounter (Signed)
Patient was schedule for 01-31-20 with PA.  Appointment was cancel do to unable to reach patient regarding the appointment.   Home # is no good/Contact left several vm no response.  Sent letter on 01-08-20- received back on 01-24-20 Return to Sender /unable to forwarded  .

## 2020-01-31 ENCOUNTER — Ambulatory Visit: Payer: Self-pay | Admitting: Cardiology

## 2021-04-28 IMAGING — CT CT HEAD W/O CM
1 series · 10 of 33 positions shown, 13 images · non-contrast
Comparison: None.

CLINICAL DATA: Headache for 2 weeks

EXAM:
CT HEAD WITHOUT CONTRAST
TECHNIQUE: Contiguous axial images were obtained from the base of the skull
through the vertex without intravenous contrast.

[Series 5: sag soft · sagittal · 0.30mm/px · 10 of 67 slices shown, 13 images]
[im 5/67  brain]
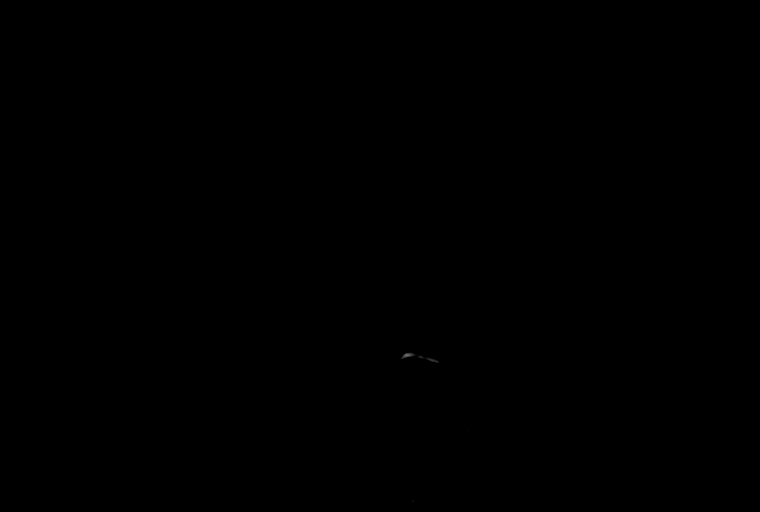
[im 5/67  bone]
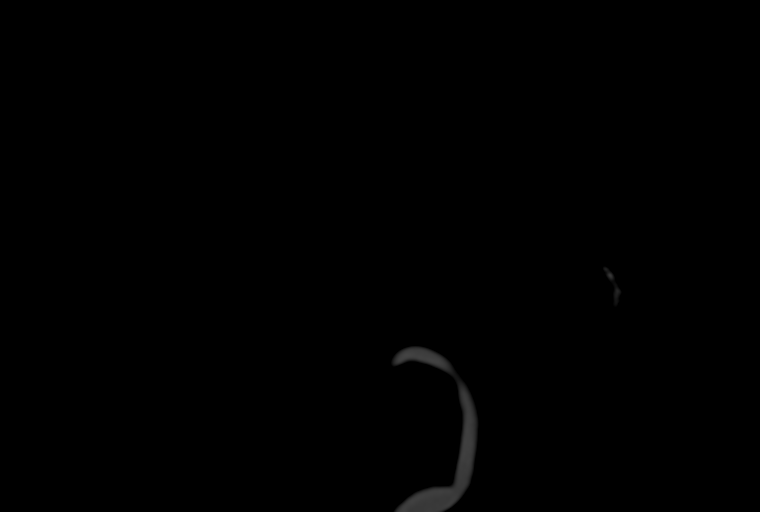
[im 12/67  brain]
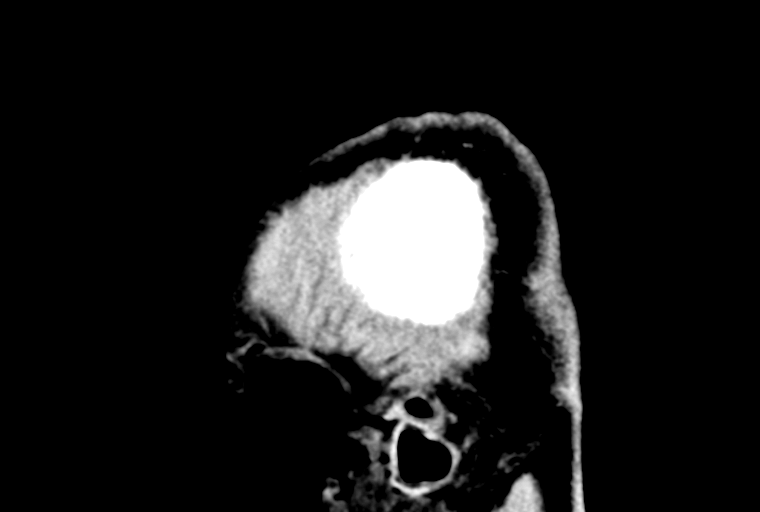
[im 19/67  brain]
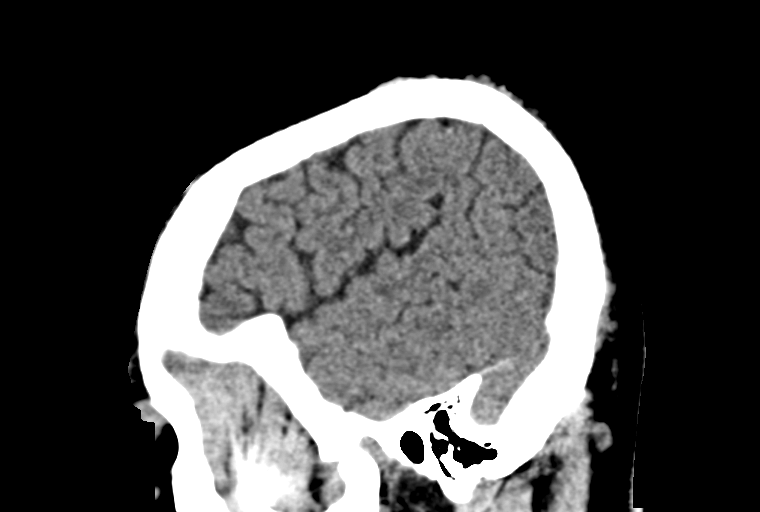
[im 26/67  brain]
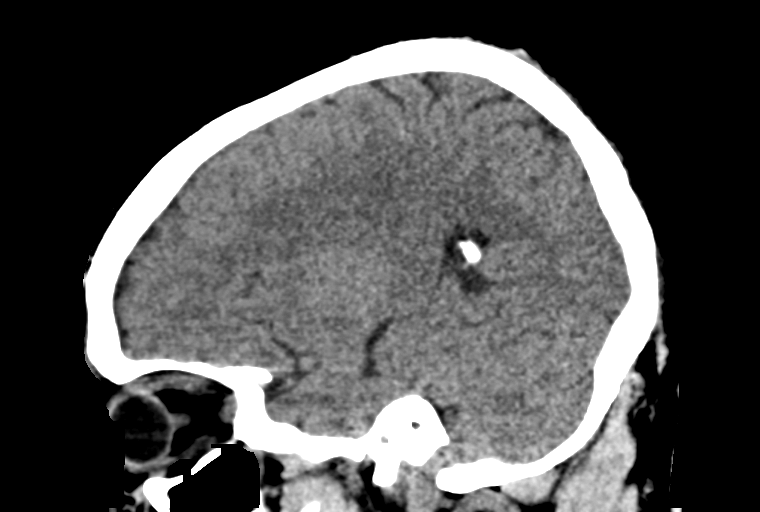
[im 32/67  brain]
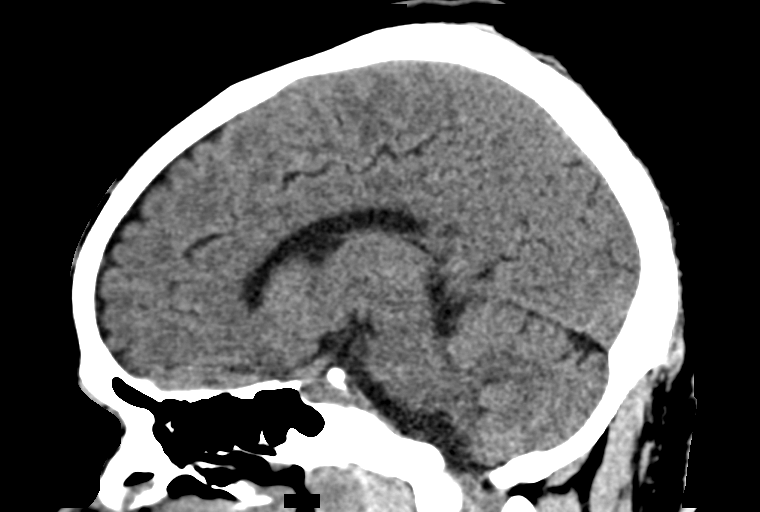
[im 32/67  bone]
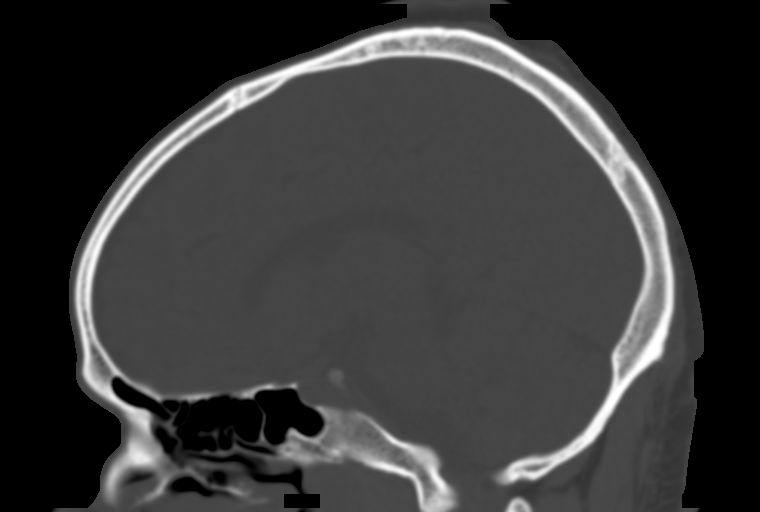
[im 39/67  brain]
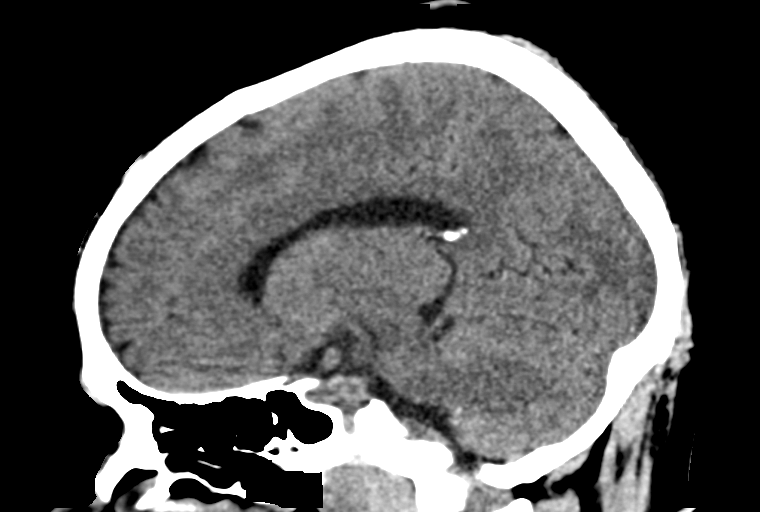
[im 45/67  brain]
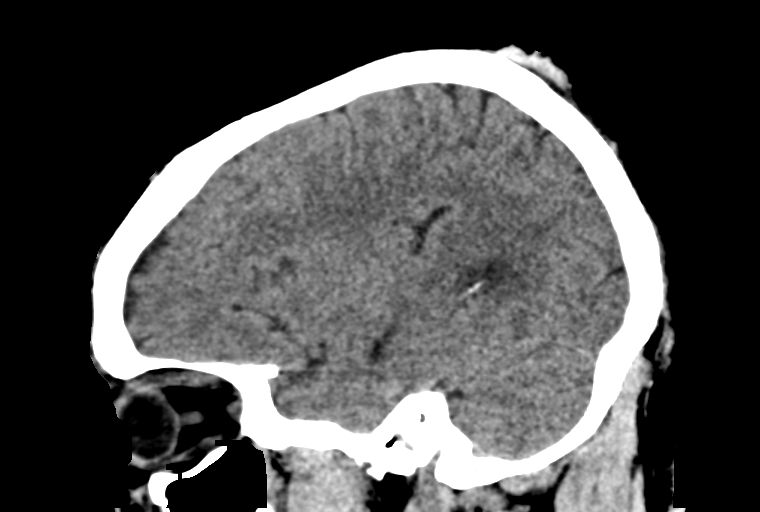
[im 51/67  brain]
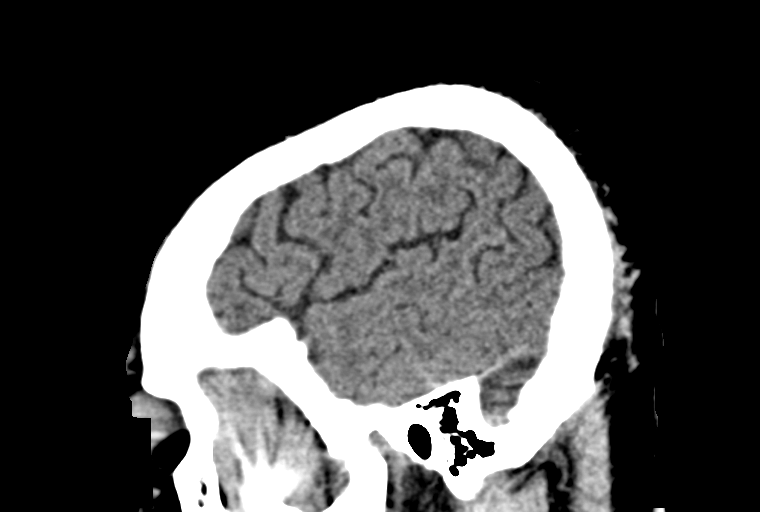
[im 56/67  brain]
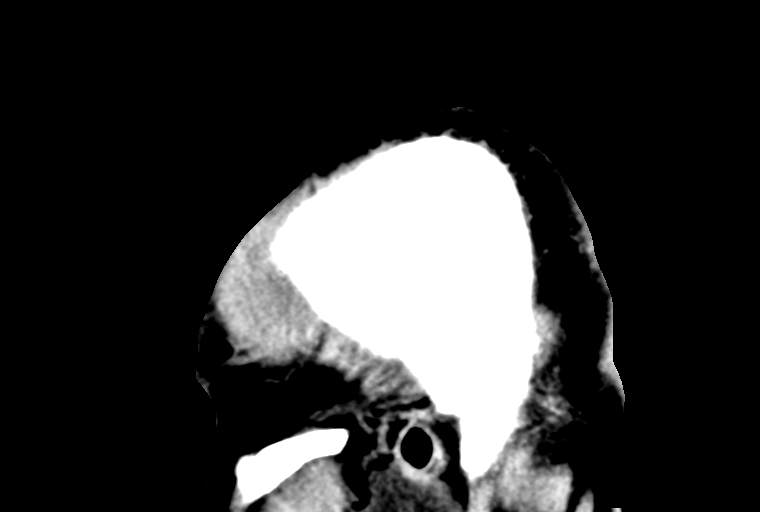
[im 56/67  bone]
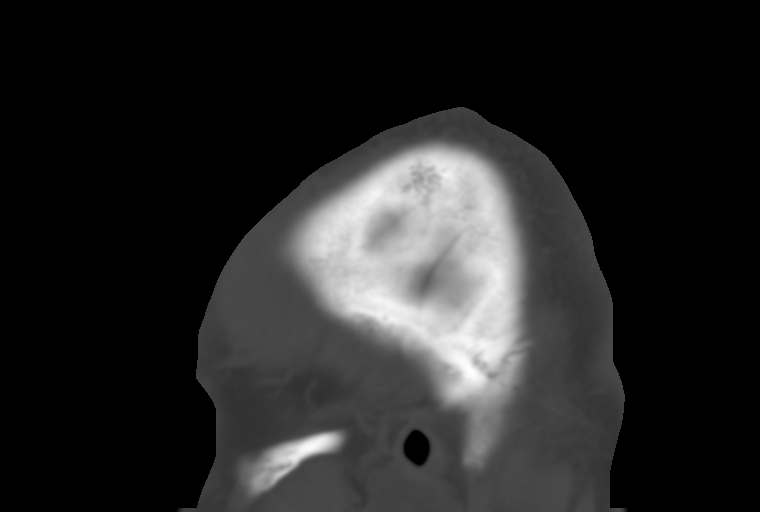
[im 62/67  brain]
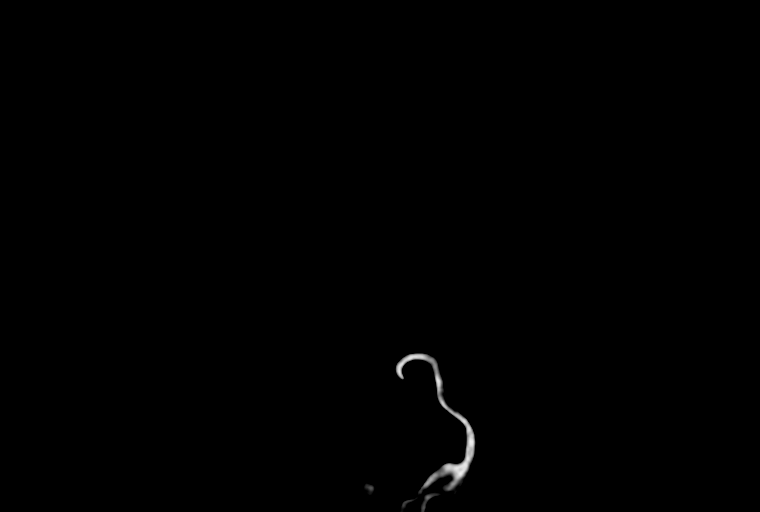

[10 of 33 positions shown; findings below may reference images not displayed]

FINDINGS: Brain: No acute territorial infarction, hemorrhage or intracranial
mass. The ventricles are nonenlarged

Vascular: No hyperdense vessel or unexpected calcification.

Skull: Normal. Negative for fracture or focal lesion.

Sinuses/Orbits: No acute finding.

Other: Skin thickening over the left parietal vertex with soft
tissue density extending towards the calvarium
IMPRESSION: 1. No CT evidence for acute intracranial abnormality.
2. Left parietal vertex scalp thickening with soft tissue density
extending to the calvarium, question an area of scarring versus skin
lesion. Correlate with physical examination.
